# Patient Record
Sex: Male | Born: 1965 | Race: White | Hispanic: No | Marital: Single | Smoking: Former smoker
Health system: Southern US, Community
[De-identification: ages and names within clinical notes are randomized; demographics above are authoritative.]

## PROBLEM LIST (undated history)

## (undated) DIAGNOSIS — Z21 Asymptomatic human immunodeficiency virus [HIV] infection status: Secondary | ICD-10-CM

## (undated) DIAGNOSIS — B20 Human immunodeficiency virus [HIV] disease: Secondary | ICD-10-CM

## (undated) HISTORY — DX: Human immunodeficiency virus (HIV) disease: B20

## (undated) HISTORY — DX: Asymptomatic human immunodeficiency virus (hiv) infection status: Z21

---

## 2002-12-25 ENCOUNTER — Ambulatory Visit (HOSPITAL_BASED_OUTPATIENT_CLINIC_OR_DEPARTMENT_OTHER): Admission: RE | Admit: 2002-12-25 | Discharge: 2002-12-25 | Payer: Self-pay | Admitting: Surgery

## 2003-04-29 ENCOUNTER — Encounter: Payer: Self-pay | Admitting: Internal Medicine

## 2003-04-29 ENCOUNTER — Encounter: Admission: RE | Admit: 2003-04-29 | Discharge: 2003-04-29 | Payer: Self-pay | Admitting: Internal Medicine

## 2004-10-25 ENCOUNTER — Ambulatory Visit: Payer: Self-pay | Admitting: Internal Medicine

## 2006-10-11 ENCOUNTER — Ambulatory Visit: Payer: Self-pay | Admitting: Internal Medicine

## 2006-10-11 LAB — CONVERTED CEMR LAB
AST: 23 units/L (ref 0–37)
Albumin: 4.6 g/dL (ref 3.5–5.2)
Alkaline Phosphatase: 60 units/L (ref 39–117)
BUN: 10 mg/dL (ref 6–23)
CO2: 29 meq/L (ref 19–32)
Calcium: 9.4 mg/dL (ref 8.4–10.5)
Chol/HDL Ratio, serum: 4.2
GFR calc non Af Amer: 100 mL/min
Glomerular Filtration Rate, Af Am: 121 mL/min/{1.73_m2}
HCT: 41.7 % (ref 39.0–52.0)
Hemoglobin: 14.2 g/dL (ref 13.0–17.0)
LDL DIRECT: 133.3 mg/dL
MCHC: 34 g/dL (ref 30.0–36.0)
Platelets: 255 10*3/uL (ref 150–400)
Total Bilirubin: 1 mg/dL (ref 0.3–1.2)
Total Protein: 7.5 g/dL (ref 6.0–8.3)
WBC: 6 10*3/uL (ref 4.5–10.5)

## 2007-02-24 ENCOUNTER — Ambulatory Visit: Payer: Self-pay | Admitting: Internal Medicine

## 2007-02-25 ENCOUNTER — Encounter: Payer: Self-pay | Admitting: Internal Medicine

## 2007-02-25 LAB — CONVERTED CEMR LAB
Bilirubin Urine: NEGATIVE
Hemoglobin, Urine: NEGATIVE
Ketones, ur: NEGATIVE mg/dL
Microalb, Ur: 0.41 mg/dL (ref 0.00–1.89)
Protein, ur: NEGATIVE mg/dL
Urine Glucose: NEGATIVE mg/dL
Urobilinogen, UA: 0.2 (ref 0.0–1.0)
WBC, UA: NONE SEEN cells/hpf (ref <3)

## 2007-09-22 ENCOUNTER — Encounter: Payer: Self-pay | Admitting: Internal Medicine

## 2008-09-06 ENCOUNTER — Encounter: Payer: Self-pay | Admitting: Internal Medicine

## 2009-01-03 ENCOUNTER — Encounter: Payer: Self-pay | Admitting: Internal Medicine

## 2009-04-25 ENCOUNTER — Encounter: Payer: Self-pay | Admitting: Internal Medicine

## 2009-08-03 ENCOUNTER — Ambulatory Visit: Payer: Self-pay | Admitting: General Practice

## 2009-08-22 ENCOUNTER — Encounter: Payer: Self-pay | Admitting: Internal Medicine

## 2009-12-12 ENCOUNTER — Encounter: Payer: Self-pay | Admitting: Internal Medicine

## 2010-04-03 ENCOUNTER — Encounter: Payer: Self-pay | Admitting: Internal Medicine

## 2011-01-09 NOTE — Letter (Signed)
Summary: HIV, stable-- Infectious Diseases  [facility]   Imported By: Lanelle Bal 04/24/2010 08:32:52  _____________________________________________________________________  External Attachment:    Type:   Image     Comment:   External Document

## 2011-01-09 NOTE — Letter (Signed)
Summary: [facility] Infectious Diseases  [facility]   Imported By: Lanelle Bal 01/16/2010 13:50:19  _____________________________________________________________________  External Attachment:    Type:   Image     Comment:   External Document

## 2011-10-11 DIAGNOSIS — B20 Human immunodeficiency virus [HIV] disease: Secondary | ICD-10-CM | POA: Insufficient documentation

## 2015-03-25 ENCOUNTER — Ambulatory Visit (INDEPENDENT_AMBULATORY_CARE_PROVIDER_SITE_OTHER): Payer: PRIVATE HEALTH INSURANCE | Admitting: Family Medicine

## 2015-03-25 ENCOUNTER — Institutional Professional Consult (permissible substitution): Payer: Self-pay | Admitting: Medical

## 2015-03-25 VITALS — BP 118/68 | HR 86 | Ht 73.0 in | Wt 157.0 lb

## 2015-03-25 DIAGNOSIS — A071 Giardiasis [lambliasis]: Secondary | ICD-10-CM

## 2015-03-25 MED ORDER — METRONIDAZOLE 250 MG PO TABS: 250.0000 mg | ORAL_TABLET | Freq: Three times a day (TID) | ORAL | Status: DC

## 2015-03-25 NOTE — Progress Notes (Signed)
   Subjective:    Patient ID: Gregory Hamilton, male    DOB: 24-Mar-1966, 49 y.o.   MRN: [id number]  HPI His dog was recently diagnosed with giardiasis. Yesterday he noted loose also smelling stool and again this morning. No fever, chills or pain. He does complain of some slight bloating.   Review of Systems     Objective:   Physical Exam Alert and in no distress. Cardiac exam shows regular rhythm without murmurs or gallops. Lungs clear to auscultation. Abdominal exam shows decreased bowel sounds without masses or tenderness       Assessment & Plan:  Giardiasis - Plan: metroNIDAZOLE (FLAGYL) 250 MG tablet I explained that it is probably best to go ahead and treat rather than do a stool evaluation. He is comfortable with this. Discussed possible side effects from the medication.

## 2016-02-01 ENCOUNTER — Telehealth: Payer: Self-pay | Admitting: Family Medicine

## 2016-02-01 NOTE — Telephone Encounter (Signed)
Per [employer] a rx for flonase to [phone]

## 2016-05-22 DIAGNOSIS — Z006 Encounter for examination for normal comparison and control in clinical research program: Secondary | ICD-10-CM | POA: Insufficient documentation

## 2017-01-14 ENCOUNTER — Ambulatory Visit: Payer: Self-pay | Admitting: *Deleted

## 2017-01-14 ENCOUNTER — Encounter: Payer: Self-pay | Admitting: *Deleted

## 2017-01-14 VITALS — BP 108/78 | HR 88

## 2017-01-14 DIAGNOSIS — R002 Palpitations: Secondary | ICD-10-CM

## 2017-01-14 NOTE — Progress Notes (Signed)
Pt walked in to clinic reporting a few episodes of "fluttering" chest this past weekend. Lasted just a few seconds. Resolved with rest. Denies associated sx such as ShOB, chest pain, diaphoresis, n/v. Could not relate episodes to stress or anxiety. Denies heavy caffeine use. No cardiac Hx. Pt requested bilateral BPs. All VS WNL. Recommended starting a journal if sx recur containing time of day, activity, caffeine intake of day, stress level. Call PCP if they recur. ER precautions given for chest pain, ShOB, unresolving palpitations, diaphoresis. Pt verbalizes understanding and agreement. No further questions/concerns.

## 2017-01-28 ENCOUNTER — Ambulatory Visit: Payer: Self-pay | Admitting: *Deleted

## 2017-01-28 DIAGNOSIS — M546 Pain in thoracic spine: Secondary | ICD-10-CM

## 2017-01-28 NOTE — Progress Notes (Signed)
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 Felt a slight pull in his L upper back after he lifted it and pivoted to the R. Slight soreness during the day, but woke up Friday with more pain. Was not evaluated on Friday because the clinic was closed due to illness. This pain has persisted over the weekend. Presents with pain, persistent but worse with movement, to L upper back, just medial to L scapula. Has used Biofreeze and an IcyHot disposable TENS unit with some relief but it persists. Placed a Thermacare heat pad to site of pain. Instructed to remove after 8 hours and not use Biofreeze with pad in place. Also instructed 800mg  Ibuprofen 3x daily with food for inflammation/pain. Appt made with NP for tomorrow. Pt verbalizes understanding and agreement with plan of care.

## 2017-01-29 ENCOUNTER — Encounter: Payer: Self-pay | Admitting: Registered Nurse

## 2017-01-29 ENCOUNTER — Ambulatory Visit: Payer: Self-pay | Admitting: Registered Nurse

## 2017-01-29 VITALS — BP 128/84 | HR 83 | Temp 98.4°F

## 2017-01-29 DIAGNOSIS — M62838 Other muscle spasm: Secondary | ICD-10-CM

## 2017-01-29 DIAGNOSIS — S46912A Strain of unspecified muscle, fascia and tendon at shoulder and upper arm level, left arm, initial encounter: Secondary | ICD-10-CM

## 2017-01-29 NOTE — Progress Notes (Signed)
Subjective:    Patient ID: Gregory Hamilton, male    DOB: 03/03/1966, 51 y.o.   MRN: [id number]  50y/o single caucasian male here for evaluation of left shoulder/upper back/neck pain left side shoulder blade/neck and upper left arm tightness with certain movements of head.  Was lifting box in store on Friday 25 Jan 2017 and shoulder blade/neck area had twinge but over weekend worsened, trouble sleeping, pain with neck flexion and tilting head.   Has TENS unit at home and it helped the most.  Tried biofreeze, motrin maybe lowered pain level 1 point.  Tried stretches, hot shower.  Thermacare patch from clinic RN helped yesterday.  Denied bowel/bladder incontinence, saddle paresthesias, arm/leg weakness/falls/dropping items.  Did not take any motrin today or put on thermacare patch.      Review of Systems  Constitutional: Negative for activity change, appetite change, chills, diaphoresis, fatigue and fever.  HENT: Negative for congestion, ear pain, facial swelling, hearing loss, sore throat, trouble swallowing and voice change.   Eyes: Negative for pain and discharge.  Respiratory: Negative for cough and wheezing.   Cardiovascular: Negative for chest pain and leg swelling.  Gastrointestinal: Negative for blood in stool, constipation, diarrhea, nausea and vomiting.  Endocrine: Negative for cold intolerance and heat intolerance.  Genitourinary: Negative for difficulty urinating, dysuria and hematuria.  Musculoskeletal: Positive for back pain, myalgias and neck pain. Negative for arthralgias, gait problem, joint swelling and neck stiffness.  Skin: Negative for rash.  Allergic/Immunologic: Positive for immunocompromised state. Negative for environmental allergies and food allergies.  Neurological: Negative for dizziness, tremors, seizures, syncope, facial asymmetry, speech difficulty, weakness, light-headedness, numbness and headaches.  Hematological: Negative for adenopathy. Does not bruise/bleed  easily.  Psychiatric/Behavioral: Positive for sleep disturbance. Negative for agitation and confusion. The patient is not nervous/anxious.        Objective:   Physical Exam  Constitutional: He is oriented to person, place, and time. Vital signs are normal. He appears well-developed and well-nourished. He is active and cooperative.  Non-toxic appearance. He does not have a sickly appearance. He does not appear ill. No distress.  HENT:  Head: Normocephalic and atraumatic.  Right Ear: External ear and ear canal normal. A middle ear effusion is present.  Left Ear: Hearing, external ear and ear canal normal. A middle ear effusion is present.  Nose: No mucosal edema, rhinorrhea, nose lacerations, sinus tenderness, nasal deformity, septal deviation or nasal septal hematoma. No epistaxis.  No foreign bodies. Right sinus exhibits no maxillary sinus tenderness and no frontal sinus tenderness. Left sinus exhibits no maxillary sinus tenderness and no frontal sinus tenderness.  Mouth/Throat: Uvula is midline and mucous membranes are normal. Mucous membranes are not pale, not dry and not cyanotic. He does not have dentures. No oral lesions. No trismus in the jaw. Normal dentition. No dental abscesses, uvula swelling, lacerations or dental caries. Posterior oropharyngeal edema and posterior oropharyngeal erythema present. No oropharyngeal exudate or tonsillar abscesses.  Cobblestoning posterior pharynx; nasally voice and bilateral TMs with air fluid level clear  Eyes: Conjunctivae, EOM and lids are normal. Pupils are equal, round, and reactive to light. Right eye exhibits no chemosis, no discharge, no exudate and no hordeolum. No foreign body present in the right eye. Left eye exhibits no chemosis, no discharge, no exudate and no hordeolum. No foreign body present in the left eye. Right conjunctiva is not injected. Right conjunctiva has no hemorrhage. Left conjunctiva is not injected. Left conjunctiva has no  hemorrhage. No scleral  icterus. Right eye exhibits normal extraocular motion and no nystagmus. Left eye exhibits normal extraocular motion and no nystagmus. Right pupil is round and reactive. Left pupil is round and reactive. Pupils are equal.  Neck: Trachea normal, normal range of motion and phonation normal. Neck supple. Muscular tenderness present. No tracheal tenderness and no spinous process tenderness present. No neck rigidity. No tracheal deviation, no edema, no erythema and normal range of motion present. No thyroid mass and no thyromegaly present.    Cardiovascular: Normal rate, regular rhythm, S1 normal, S2 normal, normal heart sounds and intact distal pulses.  PMI is not displaced.  Exam reveals no gallop and no friction rub.   No murmur heard. Pulses:      Radial pulses are 2+ on the right side, and 2+ on the left side.  Pulmonary/Chest: Effort normal and breath sounds normal. No stridor. No respiratory distress. He has no decreased breath sounds. He has no wheezes. He has no rhonchi. He has no rales.  Abdominal: Soft. He exhibits no distension.  Musculoskeletal: Normal range of motion. He exhibits tenderness. He exhibits no edema or deformity.       Right shoulder: Normal.       Left shoulder: He exhibits tenderness, pain and spasm. He exhibits normal range of motion, no bony tenderness, no swelling, no effusion, no crepitus, no deformity, no laceration, normal pulse and normal strength.       Right elbow: Normal.      Left elbow: Normal.       Right wrist: Normal.       Left wrist: Normal.       Right hip: Normal.       Left hip: Normal.       Right knee: Normal.       Left knee: Normal.       Cervical back: He exhibits tenderness, pain and spasm. He exhibits normal range of motion, no bony tenderness, no swelling, no edema, no deformity, no laceration and normal pulse.       Right upper arm: Normal.       Left upper arm: He exhibits no tenderness, no bony tenderness, no  swelling, no edema, no deformity and no laceration.       Right forearm: Normal.       Left forearm: Normal.       Right hand: Normal.       Left hand: Normal.  Increased left paraspinal and scapular discomfort with neck flexion and left lateral bending; AROM normal; bilateral hand grasp/upper and lower extremity strength 5/5 bilaterally equal; on off exam table and chair without difficulty gait sure and steady normal heel/toe gait  Lymphadenopathy:       Head (right side): No submental, no submandibular, no tonsillar, no preauricular, no posterior auricular and no occipital adenopathy present.       Head (left side): No submental, no submandibular, no tonsillar, no preauricular, no posterior auricular and no occipital adenopathy present.    He has no cervical adenopathy.       Right cervical: No superficial cervical, no deep cervical and no posterior cervical adenopathy present.      Left cervical: No superficial cervical, no deep cervical and no posterior cervical adenopathy present.  Neurological: He is alert and oriented to person, place, and time. He has normal reflexes. He displays no atrophy, no tremor and normal reflexes. No cranial nerve deficit or sensory deficit. He exhibits normal muscle tone. He displays no seizure activity. Coordination  and gait normal. GCS eye subscore is 4. GCS verbal subscore is 5. GCS motor subscore is 6.  Reflex Scores:      Bicep reflexes are 2+ on the right side and 2+ on the left side.      Brachioradialis reflexes are 2+ on the right side and 2+ on the left side.      Patellar reflexes are 2+ on the right side and 2+ on the left side. Skin: Skin is warm, dry and intact. No abrasion, no bruising, no burn, no ecchymosis, no laceration, no lesion, no petechiae and no rash noted. He is not diaphoretic. No cyanosis or erythema. No pallor. Nails show no clubbing.  Psychiatric: He has a normal mood and affect. His speech is normal and behavior is normal. Judgment  and thought content normal. Cognition and memory are normal.  Nursing note and vitals reviewed.         Assessment & Plan:  A-cervical and scapular strain, muscle spasms  P-Diclofenac 75mg  po BID x2 weeks #30 RF0.  Flexeril 10mg  po QHS prn muscle spasms #30 [facility].  Tylenol 1000mg  po q6h prn pain given 4 UD from clinic stock.  Given thermacare neck patch from clinic stock.  Has biofreeze at home may continue QID topical application over affected areas.  Home stretches demonstrated to patient-e.g. Arm circles, walking up wall, chest stretches, neck AROM, chin tucks, knee to chest and rock side to side on back.  Consider physical therapy referral if no improvement with prescribed therapy.  Ensure ergonomics correct desk at work avoid repetitive motions if possible/holding phone/laptop in hand use desk/stand.  Patient was instructed to rest, ice, and ROM exercises.  Cryotherapy and/or heat therapy, epsom salt bath soak.  Follow up with supervisor to notify them of injury at work.  RN Dereck LeepHailey Workman notified and to follow up with HR.  Activity as tolerated.  Avoid alcohol intake while taking flexeril discussed with patient it can cause drowsiness.  Avoid driving.  Discussed with patient his chronic meds and NSAIDs can put increase strain on kidneys, hydrate, avoid dehyrdation and do not take more than 1 NSAID at a time/in a 24 hours period.  Follow up if symptoms persist or worsen especially red flags saddle paresthesias/arm/leg weakness, incontinence bowel and/or bladder. Demonstrated exercises. Patient verbalized agreement and understanding of treatment plan and had no further questions at this time.  P2:  Injury Prevention and Fitness.

## 2017-01-29 NOTE — Progress Notes (Signed)
Pt with continued back pain. L upper back, just medial to L scapula. 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 Lifted and pivoted to R, felt something pull. Has tried Biofreeze, heat, and Ibuprofen with some relief but pain persists. Exacerbated by certain movements.

## 2017-01-29 NOTE — Patient Instructions (Addendum)
Gentle AROM Hydrate Stop motrin start diclofenac 75mg  by mouth every 12 hours May apply thermacare patch daily for 12 hours then remove Epsom salt soak Cryotherapy or heat therapy 15 minutes 4 times per day Neck/shoulder and upper back stretching with tennis ball, strap three times per day May take tylenol 1000mg  by mouth every 6 hours Notify supervisor regarding his injury at work

## 2017-03-07 ENCOUNTER — Ambulatory Visit: Payer: Self-pay | Admitting: Registered Nurse

## 2017-03-07 VITALS — BP 112/78 | HR 79 | Temp 97.7°F

## 2017-03-07 DIAGNOSIS — B9689 Other specified bacterial agents as the cause of diseases classified elsewhere: Secondary | ICD-10-CM

## 2017-03-07 DIAGNOSIS — J019 Acute sinusitis, unspecified: Principal | ICD-10-CM

## 2017-03-07 DIAGNOSIS — Z20828 Contact with and (suspected) exposure to other viral communicable diseases: Secondary | ICD-10-CM

## 2017-03-07 MED ORDER — SALINE SPRAY 0.65 % NA SOLN: 2.0000 | NASAL | 0 refills | Status: DC

## 2017-03-07 MED ORDER — AZITHROMYCIN 250 MG PO TABS: ORAL_TABLET | ORAL | 0 refills | Status: DC

## 2017-03-07 MED ORDER — OSELTAMIVIR PHOSPHATE 75 MG PO CAPS: 75.0000 mg | ORAL_CAPSULE | Freq: Two times a day (BID) | ORAL | 0 refills | Status: AC

## 2017-03-07 NOTE — Progress Notes (Signed)
Subjective:    Patient ID: Gregory Hamilton, male    DOB: 05/26/1966, 51 y.o.   MRN: [id number]  Single 51 y/o caucasian male with congestion and dry, hacky cough. Pt did quit smoking 1 week ago and believes it may be r/t that. Did have bil ear effusions 1 month ago in clinic. With persistant congestion and now new cough, wants to ensure nothing viral/bacterial is starting. Has been exposed to coworkers with flu this week concerned as immunosuppressed.  Infectious disease provider told him to stop flonase. Taking zyrtec and using nasal saline.  Feeling run down.  Last week upper teeth left and cheek was hurting but resolved.      Review of Systems  Constitutional: Positive for fatigue. Negative for activity change, appetite change, chills, diaphoresis, fever and unexpected weight change.  HENT: Positive for congestion, postnasal drip and rhinorrhea. Negative for dental problem, drooling, ear discharge, ear pain, facial swelling, hearing loss, mouth sores, nosebleeds, sinus pain, sinus pressure, sneezing, sore throat, tinnitus, trouble swallowing and voice change.   Eyes: Negative for photophobia, pain, discharge, redness, itching and visual disturbance.  Respiratory: Positive for cough. Negative for choking, chest tightness, shortness of breath, wheezing and stridor.   Cardiovascular: Negative for chest pain, palpitations and leg swelling.  Gastrointestinal: Negative for abdominal distention, abdominal pain, blood in stool, constipation, diarrhea, nausea and vomiting.  Endocrine: Negative for cold intolerance and heat intolerance.  Genitourinary: Negative for dysuria.  Musculoskeletal: Negative for arthralgias, back pain, gait problem, joint swelling, myalgias, neck pain and neck stiffness.  Skin: Negative for color change, pallor, rash and wound.  Allergic/Immunologic: Positive for environmental allergies. Negative for food allergies and immunocompromised state.  Neurological: Negative for  dizziness, tremors, seizures, syncope, facial asymmetry, speech difficulty, weakness, light-headedness, numbness and headaches.  Hematological: Negative for adenopathy. Does not bruise/bleed easily.  Psychiatric/Behavioral: Negative for agitation, behavioral problems, confusion and sleep disturbance.       Objective:   Physical Exam  Constitutional: He is oriented to person, place, and time. Vital signs are normal. He appears well-developed and well-nourished. He is active and cooperative.  Non-toxic appearance. He does not have a sickly appearance. He appears ill. No distress.  HENT:  Head: Normocephalic and atraumatic.  Right Ear: Hearing, external ear and ear canal normal. A middle ear effusion is present.  Left Ear: Hearing, external ear and ear canal normal. A middle ear effusion is present.  Nose: Mucosal edema and rhinorrhea present. No nose lacerations, sinus tenderness, nasal deformity, septal deviation or nasal septal hematoma. No epistaxis.  No foreign bodies. Right sinus exhibits no maxillary sinus tenderness and no frontal sinus tenderness. Left sinus exhibits no maxillary sinus tenderness and no frontal sinus tenderness.  Mouth/Throat: Uvula is midline and mucous membranes are normal. Mucous membranes are not pale, not dry and not cyanotic. He does not have dentures. No oral lesions. No trismus in the jaw. Normal dentition. No dental abscesses, uvula swelling, lacerations or dental caries. Posterior oropharyngeal edema and posterior oropharyngeal erythema present. No oropharyngeal exudate or tonsillar abscesses.  Cobblestoning posterior pharynx; bilateral TMs air fluid level clear; bilateral nasal turbinates edema/erythema clear discharge; bilateral allergic shiners  Eyes: Conjunctivae, EOM and lids are normal. Pupils are equal, round, and reactive to light. Right eye exhibits no chemosis, no discharge, no exudate and no hordeolum. No foreign body present in the right eye. Left eye  exhibits no chemosis, no discharge, no exudate and no hordeolum. No foreign body present in the left eye.  Right conjunctiva is not injected. Right conjunctiva has no hemorrhage. Left conjunctiva is not injected. Left conjunctiva has no hemorrhage. No scleral icterus. Right eye exhibits normal extraocular motion and no nystagmus. Left eye exhibits normal extraocular motion and no nystagmus. Right pupil is round and reactive. Left pupil is round and reactive. Pupils are equal.  Neck: Trachea normal and normal range of motion. Neck supple. No tracheal tenderness, no spinous process tenderness and no muscular tenderness present. No neck rigidity. No tracheal deviation, no edema, no erythema and normal range of motion present. No thyroid mass and no thyromegaly present.  Cardiovascular: Normal rate, regular rhythm, S1 normal, S2 normal, normal heart sounds and intact distal pulses.  PMI is not displaced.  Exam reveals no gallop and no friction rub.   No murmur heard. Pulmonary/Chest: Effort normal and breath sounds normal. No stridor. No respiratory distress. He has no decreased breath sounds. He has no wheezes. He has no rhonchi. He has no rales.  Speaks full sentences without difficulty; no cough observed in clinic  Abdominal: Soft. He exhibits no distension.  Musculoskeletal: Normal range of motion. He exhibits no edema or tenderness.       Right shoulder: Normal.       Left shoulder: Normal.       Right elbow: Normal.      Left elbow: Normal.       Right hip: Normal.       Left hip: Normal.       Right knee: Normal.       Left knee: Normal.       Cervical back: Normal.  Lymphadenopathy:       Head (right side): No submental, no submandibular, no tonsillar, no preauricular, no posterior auricular and no occipital adenopathy present.       Head (left side): No submental, no submandibular, no tonsillar, no preauricular, no posterior auricular and no occipital adenopathy present.    He has no cervical  adenopathy.       Right cervical: No superficial cervical, no deep cervical and no posterior cervical adenopathy present.      Left cervical: No superficial cervical, no deep cervical and no posterior cervical adenopathy present.  Neurological: He is alert and oriented to person, place, and time. He is not disoriented. He displays no atrophy and no tremor. No cranial nerve deficit or sensory deficit. He exhibits normal muscle tone. He displays no seizure activity. Coordination and gait normal. GCS eye subscore is 4. GCS verbal subscore is 5. GCS motor subscore is 6.  Skin: Skin is warm, dry and intact. No abrasion, no bruising, no burn, no ecchymosis, no laceration, no lesion, no petechiae and no rash noted. He is not diaphoretic. No cyanosis or erythema. No pallor. Nails show no clubbing.  Psychiatric: He has a normal mood and affect. His speech is normal and behavior is normal. Judgment and thought content normal. Cognition and memory are normal.  Nursing note and vitals reviewed.         Assessment & Plan:  Electronic Rx tamiflu 75mg  po BID x 5 days #10 RF0  Hydrate hydrate hydrate.if unable to maintain po intake or urine orange/brown/unable to void every 8 hours follow up with PCM/UCC/ER for re-evaluation  Tylenol 1000mg  po q6h prn pain/fever. Sp02 decreased from usual 98%+.  Given azithromycin 500mg  po day 1 then 250mg  po daily days 2-5 #6 RF0 from [facility] as immunocompromised also.  Suspect Viral illness: no evidence of invasive bacterial infection, non toxic  and well hydrated.  This is most likely self limiting viral infection.  I do not see where any further testing or imaging is necessary at this time.   I will suggest supportive care, rest, good hygiene and encourage the patient to take adequate fluids.  Does not require work excuse. Patient to talk with supervisor discussed recommend staying home until afebrile 24 hours off tylenol. nasal saline 1-2 sprays each nostril prn q2h,.  Discussed  honey with lemon and salt water gargles for comfort also.  The patient is to return to clinic or EMERGENCY ROOM if symptoms worsen or change significantly e.g. Dyspnea, dysphagia, vomiting, lethargy, SOB, wheezing. Patient verbalized agreement and understanding of treatment plan and had no further questions at this time.  Patient may use normal saline nasal spray as needed.  Continue antihistamine oral consider antihistamine nasal since unable to utilize nasal steroid per infectious disease provider.  Avoid triggers if possible.  Shower prior to bedtime if exposed to triggers.  If allergic dust/dust mites recommend mattress/pillow covers/encasements; washing linens, vacuuming, sweeping, dusting weekly.  Call or return to clinic as needed if these symptoms worsen or fail to improve as anticipated.   Exitcare handout on allergic rhinitis given to patient.  Patient verbalized understanding of instructions, agreed with plan of care and had no further questions at this time.  P2:  Avoidance and hand washing.   Symptomatic therapy suggested fluids, NSAIDs and rest.  May take Tylenol or Motrin for fevers.  Call or return to clinic as needed if these symptoms worsen or fail to improve as anticipated. Exitcare handout on otitis media effusion given to patient.  Patient verbalized agreement and understanding of treatment plan.   P2:  Hand washing  Bronchitis simple, community acquired, may have started as viral (probably respiratory syncytial, parainfluenza, influenza, or adenovirus), but now evidence of acute purulent bronchitis with resultant bronchial edema and mucus formation.  Viruses are the most common cause of bronchial inflammation in otherwise healthy adults with acute bronchitis.  The appearance of sputum is not predictive of whether a bacterial infection is present.  Purulent sputum is most often caused by viral infections.  There are a small portion of those caused by non-viral agents being Mycoplamsa  pneumonia.  Microscopic examination or C&S of sputum in the healthy adult with acute bronchitis is generally not helpful (usually negative or normal respiratory flora) other considerations being cough from upper respiratory tract infections, sinusitis or allergic syndromes (mild asthma or viral pneumonia).  Differential Diagnosis:  reactive airway disease (asthma, allergic aspergillosis (eosinophilia), chronic bronchitis, respiratory infection (Sinusitis, Common cold, pneumonia), congestive heart failure, reflux esophagitis, bronchogenic tumor, aspiration syndromes and/or exposure irritants/tobacco smoke.  In this case, there is no evidence of any invasive bacterial illness.  Most likely viral etiology so will hold on antibiotic treatment.  Advise supportive care with rest, encourage fluids, good hygiene and watch for any worsening symptoms.  If they were to develop:  come back to the office or go to the emergency room if after hours. Without high fever, severe dyspnea, lack of physical findings or other risk factors, I will hold on a chest radiograph and CBC at this time. I discussed that approximately 50% of patients with acute bronchitis have a cough that lasts up to three weeks, and 25% for over a month.  Tylenol, one to two tablets every four hours as needed for fever or myalgias.   No aspirin.  Patient instructed to follow up in one week or sooner  if symptoms worsen. Patient verbalized agreement and understanding of treatment plan.  P2:  hand washing and cover cough

## 2017-03-07 NOTE — Patient Instructions (Addendum)
Continue mucinex dm by mouth twice a day Zyrtec 10mg  by mouth daily Nasal saline 2 sprays each nostril every 2 hours while awake as needed for congestion Use over the counter cough suppressant delsym, nyquil and/or robitussin as needed per manufacturer's instructions Cough drops by mouth ever 2 hours as needed for cough Shower twice a day Hydrate, honey with lemon, popsicles Rest  Start tamiflu 75mg  by mouth twice a day for 5 days Tylenol 1000mg  by mouth every 6 hours as needed for pain  If worsening sinus pressure/sore throat/ear pain/productive cough start azithromycin 500mg  by mouth day 1 (2 tabs) then take 1 tablet 250mg  by mouth day days 2-5 dispensed from [facility]  Seek re-evaluation at PCM/urgent care/ER same day if difficulty breathing/shortness of breath/coughing up blood, vomiting blood, pooping or peeing blood red or black.  If unable to keep down fluids, getting dizzy, short of breath or unable to pee every 6 -8 hours (urine orange/brown)   Influenza, Adult Influenza, more commonly known as "the flu," is a viral infection that primarily affects the respiratory tract. The respiratory tract includes organs that help you breathe, such as the lungs, nose, and throat. The flu causes many common cold symptoms, as well as a high fever and body aches. The flu spreads easily from person to person (is contagious). Getting a flu shot (influenza vaccination) every year is the best way to prevent influenza. What are the causes? Influenza is caused by a virus. You can catch the virus by:  Breathing in droplets from an infected person's cough or sneeze.  Touching something that was recently contaminated with the virus and then touching your mouth, nose, or eyes. What increases the risk? The following factors may make you more likely to get the flu:  Not cleaning your hands frequently with soap and water or alcohol-based hand sanitizer.  Having close contact with many people during cold and  flu season.  Touching your mouth, eyes, or nose without washing or sanitizing your hands first.  Not drinking enough fluids or not eating a healthy diet.  Not getting enough sleep or exercise.  Being under a high amount of stress.  Not getting a yearly (annual) flu shot. You may be at a higher risk of complications from the flu, such as a severe lung infection (pneumonia), if you:  Are over the age of 42.  Are pregnant.  Have a weakened disease-fighting system (immune system). You may have a weakened immune system if you:  Have HIV or AIDS.  Are undergoing chemotherapy.  Aretaking medicines that reduce the activity of (suppress) the immune system.  Have a long-term (chronic) illness, such as heart disease, kidney disease, diabetes, or lung disease.  Have a liver disorder.  Are obese.  Have anemia. What are the signs or symptoms? Symptoms of this condition typically last 4-10 days and may include:  Fever.  Chills.  Headache, body aches, or muscle aches.  Sore throat.  Cough.  Runny or congested nose.  Chest discomfort and cough.  Poor appetite.  Weakness or tiredness (fatigue).  Dizziness.  Nausea or vomiting. How is this diagnosed? This condition may be diagnosed based on your medical history and a physical exam. Your health care provider may do a nose or throat swab test to confirm the diagnosis. How is this treated? If influenza is detected early, you can be treated with antiviral medicine that can reduce the length of your illness and the severity of your symptoms. This medicine may be given by  mouth (orally) or through an IV tube that is inserted in one of your veins. The goal of treatment is to relieve symptoms by taking care of yourself at home. This may include taking over-the-counter medicines, drinking plenty of fluids, and adding humidity to the air in your home. In some cases, influenza goes away on its own. Severe influenza or complications  from influenza may be treated in a hospital. Follow these instructions at home:  Take over-the-counter and prescription medicines only as told by your health care provider.  Use a cool mist humidifier to add humidity to the air in your home. This can make breathing easier.  Rest as needed.  Drink enough fluid to keep your urine clear or pale yellow.  Cover your mouth and nose when you cough or sneeze.  Wash your hands with soap and water often, especially after you cough or sneeze. If soap and water are not available, use hand sanitizer.  Stay home from work or school as told by your health care provider. Unless you are visiting your health care provider, try to avoid leaving home until your fever has been gone for 24 hours without the use of medicine.  Keep all follow-up visits as told by your health care provider. This is important. How is this prevented?  Getting an annual flu shot is the best way to avoid getting the flu. You may get the flu shot in late summer, fall, or winter. Ask your health care provider when you should get your flu shot.  Wash your hands often or use hand sanitizer often.  Avoid contact with people who are sick during cold and flu season.  Eat a healthy diet, drink plenty of fluids, get enough sleep, and exercise regularly. Contact a health care provider if:  You develop new symptoms.  You have:  Chest pain.  Diarrhea.  A fever.  Your cough gets worse.  You produce more mucus.  You feel nauseous or you vomit. Get help right away if:  You develop shortness of breath or difficulty breathing.  Your skin or nails turn a bluish color.  You have severe pain or stiffness in your neck.  You develop a sudden headache or sudden pain in your face or ear.  You cannot stop vomiting. This information is not intended to replace advice given to you by your health care provider. Make sure you discuss any questions you have with your health care  provider. Document Released: 11/23/2000 Document Revised: 05/03/2016 Document Reviewed: 09/20/2015 Elsevier Interactive Patient Education  2017 [employer]  Acute Bronchitis, Adult Acute bronchitis is sudden (acute) swelling of the air tubes (bronchi) in the lungs. Acute bronchitis causes these tubes to fill with mucus, which can make it hard to breathe. It can also cause coughing or wheezing. In adults, acute bronchitis usually goes away within 2 weeks. A cough caused by bronchitis may last up to 3 weeks. Smoking, allergies, and asthma can make the condition worse. Repeated episodes of bronchitis may cause further lung problems, such as chronic obstructive pulmonary disease (COPD). What are the causes? This condition can be caused by germs and by substances that irritate the lungs, including:  Cold and flu viruses. This condition is most often caused by the same virus that causes a cold.  Bacteria.  Exposure to tobacco smoke, dust, fumes, and air pollution. What increases the risk? This condition is more likely to develop in people who:  Have close contact with someone with acute bronchitis.  Are exposed  to lung irritants, such as tobacco smoke, dust, fumes, and vapors.  Have a weak immune system.  Have a respiratory condition such as asthma. What are the signs or symptoms? Symptoms of this condition include:  A cough.  Coughing up clear, yellow, or green mucus.  Wheezing.  Chest congestion.  Shortness of breath.  A fever.  Body aches.  Chills.  A sore throat. How is this diagnosed? This condition is usually diagnosed with a physical exam. During the exam, your health care provider may order tests, such as chest X-rays, to rule out other conditions. He or she may also:  Test a sample of your mucus for bacterial infection.  Check the level of oxygen in your blood. This is done to check for pneumonia.  Do a chest X-ray or lung function testing to rule out  pneumonia and other conditions.  Perform blood tests. Your health care provider will also ask about your symptoms and medical history. How is this treated? Most cases of acute bronchitis clear up over time without treatment. Your health care provider may recommend:  Drinking more fluids. Drinking more makes your mucus thinner, which may make it easier to breathe.  Taking a medicine for a fever or cough.  Taking an antibiotic medicine.  Using an inhaler to help improve shortness of breath and to control a cough.  Using a cool mist vaporizer or humidifier to make it easier to breathe. Follow these instructions at home: Medicines   Take over-the-counter and prescription medicines only as told by your health care provider.  If you were prescribed an antibiotic, take it as told by your health care provider. Do not stop taking the antibiotic even if you start to feel better. General instructions   Get plenty of rest.  Drink enough fluids to keep your urine clear or pale yellow.  Avoid smoking and secondhand smoke. Exposure to cigarette smoke or irritating chemicals will make bronchitis worse. If you smoke and you need help quitting, ask your health care provider. Quitting smoking will help your lungs heal faster.  Use an inhaler, cool mist vaporizer, or humidifier as told by your health care provider.  Keep all follow-up visits as told by your health care provider. This is important. How is this prevented? To lower your risk of getting this condition again:  Wash your hands often with soap and water. If soap and water are not available, use hand sanitizer.  Avoid contact with people who have cold symptoms.  Try not to touch your hands to your mouth, nose, or eyes.  Make sure to get the flu shot every year. Contact a health care provider if:  Your symptoms do not improve in 2 weeks of treatment. Get help right away if:  You cough up blood.  You have chest pain.  You have  severe shortness of breath.  You become dehydrated.  You faint or keep feeling like you are going to faint.  You keep vomiting.  You have a severe headache.  Your fever or chills gets worse. This information is not intended to replace advice given to you by your health care provider. Make sure you discuss any questions you have with your health care provider. Document Released: 01/03/2005 Document Revised: 06/20/2016 Document Reviewed: 05/16/2016 Elsevier Interactive Patient Education  2017 [employer]  Sinusitis, Adult Sinusitis is soreness and inflammation of your sinuses. Sinuses are hollow spaces in the bones around your face. Your sinuses are located:  Around your eyes.  In the  middle of your forehead.  Behind your nose.  In your cheekbones. Your sinuses and nasal passages are lined with a stringy fluid (mucus). Mucus normally drains out of your sinuses. When your nasal tissues become inflamed or swollen, the mucus can become trapped or blocked so air cannot flow through your sinuses. This allows bacteria, viruses, and funguses to grow, which leads to infection. Sinusitis can develop quickly and last for 7?10 days (acute) or for more than 12 weeks (chronic). Sinusitis often develops after a cold. What are the causes? This condition is caused by anything that creates swelling in the sinuses or stops mucus from draining, including:  Allergies.  Asthma.  Bacterial or viral infection.  Abnormally shaped bones between the nasal passages.  Nasal growths that contain mucus (nasal polyps).  Narrow sinus openings.  Pollutants, such as chemicals or irritants in the air.  A foreign object stuck in the nose.  A fungal infection. This is rare. What increases the risk? The following factors may make you more likely to develop this condition:  Having allergies or asthma.  Having had a recent cold or respiratory tract infection.  Having structural deformities or  blockages in your nose or sinuses.  Having a weak immune system.  Doing a lot of swimming or diving.  Overusing nasal sprays.  Smoking. What are the signs or symptoms? The main symptoms of this condition are pain and a feeling of pressure around the affected sinuses. Other symptoms include:  Upper toothache.  Earache.  Headache.  Bad breath.  Decreased sense of smell and taste.  A cough that may get worse at night.  Fatigue.  Fever.  Thick drainage from your nose. The drainage is often green and it may contain pus (purulent).  Stuffy nose or congestion.  Postnasal drip. This is when extra mucus collects in the throat or back of the nose.  Swelling and warmth over the affected sinuses.  Sore throat.  Sensitivity to light. How is this diagnosed? This condition is diagnosed based on symptoms, a medical history, and a physical exam. To find out if your condition is acute or chronic, your health care provider may:  Look in your nose for signs of nasal polyps.  Tap over the affected sinus to check for signs of infection.  View the inside of your sinuses using an imaging device that has a light attached (endoscope). If your health care provider suspects that you have chronic sinusitis, you may also:  Be tested for allergies.  Have a sample of mucus taken from your nose (nasal culture) and checked for bacteria.  Have a mucus sample examined to see if your sinusitis is related to an allergy. If your sinusitis does not respond to treatment and it lasts longer than 8 weeks, you may have an MRI or CT scan to check your sinuses. These scans also help to determine how severe your infection is. In rare cases, a bone biopsy may be done to rule out more serious types of fungal sinus disease. How is this treated? Treatment for sinusitis depends on the cause and whether your condition is chronic or acute. If a virus is causing your sinusitis, your symptoms will go away on their  own within 10 days. You may be given medicines to relieve your symptoms, including:  Topical nasal decongestants. They shrink swollen nasal passages and let mucus drain from your sinuses.  Antihistamines. These drugs block inflammation that is triggered by allergies. This can help to ease swelling in your  nose and sinuses.  Topical nasal corticosteroids. These are nasal sprays that ease inflammation and swelling in your nose and sinuses.  Nasal saline washes. These rinses can help to get rid of thick mucus in your nose. If your condition is caused by bacteria, you will be given an antibiotic medicine. If your condition is caused by a fungus, you will be given an antifungal medicine. Surgery may be needed to correct underlying conditions, such as narrow nasal passages. Surgery may also be needed to remove polyps. Follow these instructions at home: Medicines   Take, use, or apply over-the-counter and prescription medicines only as told by your health care provider. These may include nasal sprays.  If you were prescribed an antibiotic medicine, take it as told by your health care provider. Do not stop taking the antibiotic even if you start to feel better. Hydrate and Humidify   Drink enough water to keep your urine clear or pale yellow. Staying hydrated will help to thin your mucus.  Use a cool mist humidifier to keep the humidity level in your home above 50%.  Inhale steam for 10-15 minutes, 3-4 times a day or as told by your health care provider. You can do this in the bathroom while a hot shower is running.  Limit your exposure to cool or dry air. Rest   Rest as much as possible.  Sleep with your head raised (elevated).  Make sure to get enough sleep each night. General instructions   Apply a warm, moist washcloth to your face 3-4 times a day or as told by your health care provider. This will help with discomfort.  Wash your hands often with soap and water to reduce your  exposure to viruses and other germs. If soap and water are not available, use hand sanitizer.  Do not smoke. Avoid being around people who are smoking (secondhand smoke).  Keep all follow-up visits as told by your health care provider. This is important. Contact a health care provider if:  You have a fever.  Your symptoms get worse.  Your symptoms do not improve within 10 days. Get help right away if:  You have a severe headache.  You have persistent vomiting.  You have pain or swelling around your face or eyes.  You have vision problems.  You develop confusion.  Your neck is stiff.  You have trouble breathing. This information is not intended to replace advice given to you by your health care provider. Make sure you discuss any questions you have with your health care provider. Document Released: 11/26/2005 Document Revised: 07/22/2016 Document Reviewed: 09/21/2015 Elsevier Interactive Patient Education  2017 [employer].

## 2017-09-03 ENCOUNTER — Encounter: Payer: Self-pay | Admitting: Family Medicine

## 2017-09-03 ENCOUNTER — Other Ambulatory Visit: Payer: Self-pay | Admitting: Family Medicine

## 2017-09-03 ENCOUNTER — Ambulatory Visit (HOSPITAL_COMMUNITY)
Admission: RE | Admit: 2017-09-03 | Discharge: 2017-09-03 | Disposition: A | Discharge status: Home / Self Care | Payer: No Typology Code available for payment source | Source: Ambulatory Visit | Attending: Registered Nurse | Admitting: Registered Nurse

## 2017-09-03 ENCOUNTER — Telehealth: Payer: Self-pay | Admitting: Family Medicine

## 2017-09-03 ENCOUNTER — Ambulatory Visit: Payer: Self-pay | Admitting: Registered Nurse

## 2017-09-03 DIAGNOSIS — X58XXXA Exposure to other specified factors, initial encounter: Secondary | ICD-10-CM | POA: Diagnosis not present

## 2017-09-03 DIAGNOSIS — S43014A Anterior dislocation of right humerus, initial encounter: Secondary | ICD-10-CM

## 2017-09-03 DIAGNOSIS — S43004A Unspecified dislocation of right shoulder joint, initial encounter: Secondary | ICD-10-CM

## 2017-09-03 MED ORDER — IBUPROFEN 800 MG PO TABS: 800.0000 mg | ORAL_TABLET | Freq: Three times a day (TID) | ORAL | 0 refills | Status: DC

## 2017-09-03 MED ORDER — ACETAMINOPHEN 500 MG PO TABS: 1000.0000 mg | ORAL_TABLET | Freq: Four times a day (QID) | ORAL | 0 refills | Status: AC | PRN

## 2017-09-03 NOTE — Progress Notes (Addendum)
Patient ID: Gregory Hamilton, male   DOB: Sep 13, 1966, 51 y.o.   MRN: [id number]  Spoke with pt via phone [phone] to give x-ray results. Sts he has already spoken with pcp/[facility] about results and has been ordered a sling. PCP office is supposed to be sending restriction documentation to [employer] regarding sling use for next week minimum. Plan to enter shoulder rehabilitation soon after initial healing period. Pt had no questions or concerns.

## 2017-09-03 NOTE — Progress Notes (Signed)
He injured his right shoulder playing volleyball while at work.he has a previous history of shoulder dislocation and subsequent surgery. He relocated his shoulder on his own. X-rays were reviewed and show no fracture. I will give him a sling and set him up for shoulder rehabilitation. Discussed the fact that he might end up having to have surgery again if this continues to dislocate. He is well aware of this problem.

## 2017-09-03 NOTE — Patient Instructions (Addendum)
Contact your ortho provider and schedule appt ER if fingers/arm cold/numb Ice/immobilize today right arm may use pillow under right arm Then gentle arom avoid impact/heavy lifting with right arm Xray today right shoulder Consider contacting [facility] for PT referral  Shoulder Dislocation A shoulder dislocation happens when the upper arm bone (humerus) moves out of the shoulder joint. The shoulder joint is the part of the shoulder where the humerus, shoulder blade (scapula), and collarbone (clavicle) meet. What are the causes? This condition is often caused by:  A fall.  A hit to the shoulder.  A forceful movement of the shoulder.  What increases the risk? This condition is more likely to develop in people who play sports. What are the signs or symptoms? Symptoms of this condition include:  Deformity of the shoulder.  Intense pain.  Inability to move the shoulder.  Numbness, weakness, or tingling in your neck or down your arm.  Bruising or swelling around your shoulder.  How is this diagnosed? This condition is diagnosed with a physical exam. After the exam, tests may be done to check for related problems. Tests that may be done include:  X-ray. This may be done to check for broken bones.  MRI. This may be done to check for damage to the tissues around the shoulder.  Electromyogram. This may be done to check for nerve damage.  How is this treated? This condition is treated with a procedure to place the humerus back in the joint. This procedure is called a reduction. There are two types of reduction:  Closed reduction. In this procedure, the humerus is placed back in the joint without surgery. The health care provider uses his or her hands to guide the bone back into place.  Open reduction. In this procedure, the humerus is placed back in the joint with surgery. An open reduction may be recommended if: ? You have a weak shoulder joint or weak ligaments. ? You have had more  than one shoulder dislocation. ? The nerves or blood vessels around your shoulder have been damaged.  After the humerus is placed back into the joint, your arm will be placed in a splint or sling to prevent it from moving. You will need to wear the splint or sling until your shoulder heals. When the splint or sling is removed, you may have physical therapy to help improve the range of motion in your shoulder joint. Follow these instructions at home: If you have a splint or sling:  Wear it as told by your health care provider. Remove it only as told by your health care provider.  Loosen it if your fingers become numb and tingle, or if they turn cold and blue.  Keep it clean and dry. Bathing  Do not take baths, swim, or use a hot tub until your health care provider approves. Ask your health care provider if you can take showers. You may only be allowed to take sponge baths for bathing.  If your health care provider approves bathing and showering, cover your splint or sling with a watertight plastic bag to protect it from water. Do not let the splint or sling get wet. Managing pain, stiffness, and swelling  If directed, apply ice to the injured area. ? Put ice in a plastic bag. ? Place a towel between your skin and the bag. ? Leave the ice on for 20 minutes, 2-3 times per day.  Move your fingers often to avoid stiffness and to decrease swelling.  Raise (elevate)  the injured area above the level of your heart while you are sitting or lying down. Driving  Do not drive while wearing a splint or sling on a hand that you use for driving.  Do not drive or operate heavy machinery while taking pain medicine. Activity  Return to your normal activities as told by your health care provider. Ask your health care provider what activities are safe for you.  Perform range-of-motion exercises only as told by your health care provider.  Exercise your hand by squeezing a soft ball. This helps to  decrease stiffness and swelling in your hand and wrist. General instructions  Take over-the-counter and prescription medicines only as told by your health care provider.  Do not use any tobacco products, including cigarettes, chewing tobacco, or e-cigarettes. Tobacco can delay bone and tissue healing. If you need help quitting, ask your health care provider.  Keep all follow-up visits as told by your health care provider. This is important. Contact a health care provider if:  Your splint or sling gets damaged. Get help right away if:  Your pain gets worse rather than better.  You lose feeling in your arm or hand.  Your arm or hand becomes white and cold. This information is not intended to replace advice given to you by your health care provider. Make sure you discuss any questions you have with your health care provider. Document Released: 08/21/2001 Document Revised: 07/22/2016 Document Reviewed: 03/21/2015 Elsevier Interactive Patient Education  2018 [employer]. Recurrent Shoulder Laxity and Instability Shoulder laxity is a condition in which the shoulder joint is loose and it moves more than normal. Shoulder instability means that the shoulder can move slightly out of its socket (subluxation) or move completely out of its socket (dislocation) more easily than usual. The shoulder is a ball-and-socket joint. The rounded part of the upper arm bone (humeral head, or "ball") fits into a cup-like socket in the upper part of the shoulder blade (glenoid). When you have shoulder laxity and instability, the humeral head can slip out of place in a forward, backward, or downward direction. This can happen repeatedly (recurrently). What are the causes? This condition is usually caused by overusing the shoulder, especially if you already have loose bands of tissue (ligaments and tendons) surrounding the shoulder joint (shoulder capsule). What increases the risk? The following factors may make you  more likely to develop this condition:  Having Ehlers-Danlos syndrome or Marfan syndrome.  Injury to the shoulder in the past.  There is also a greater risk of this condition among athletes who participate in:  Swimming.  Sports that involve throwing.  Racquet sports.  Volleyball.  Gymnastics.  What are the signs or symptoms? Shoulder pain is the main symptom of this condition. Pain often gets worse with:  Overhead motions.  Throwing motions.  Carrying or pushing heavy objects.  Other symptoms may include:  Weakness.  Muscles getting tired sooner than normal (early muscle fatigue) with repetitive arm use.  Clicking or popping in the shoulder.  Feeling nervous to move the arm in certain directions (apprehension).  Numbness and tingling in the arm.  Having more range of motion than normal (hypermobility).  How is this diagnosed? This condition may be diagnosed based on:  Your symptoms.  Your medical history.  A physical exam. Your health care provider will move your shoulder, test your strength, and check for hypermobility.  MRI.  How is this treated? Treatment for this condition may include:  Resting your shoulder  and avoiding all activities that cause shoulder pain, such as overhead and throwing motions.  NSAIDs to help reduce pain and swelling.  Physical therapy.  Moving your shoulder back into place (reduction). Your health care provider may do this if your shoulder dislocates and does not move back into place. Before reduction, you may be given an injection of numbing medicine and a medicine to help you relax (sedative).  Surgery. This may be done if nonsurgical treatments do not help. The most common surgery is a procedure to tighten and repair the loose parts of your shoulder joint.  Follow these instructions at home: Managing pain, stiffness, and swelling   Move your fingers often to avoid stiffness and to lessen swelling.  Raise (elevate)  your upper body on pillows when you are lying down or sleeping. Do not sleep in a position that puts pressure on your shoulder. For example: ? Do not sleep on the front of your body (abdomen). ? Do not sleep with your arm over your head.  If directed, put ice on your shoulder. ? Put ice in a plastic bag. ? Place a towel between your skin and the bag. ? Leave the ice on for 20 minutes, 2-3 times a day. Driving  Do not drive or operate heavy machinery while taking prescription pain medicine.  Ask your health care provider when it is safe for you to drive. Activity  Return to your normal activities as told by your health care provider. Ask your health care provider what activities are safe for you.  Do exercises as told by your health care provider. General instructions  Do not use any tobacco products, including cigarettes, chewing tobacco, or e-cigarettes. Tobacco can delay healing. If you need help quitting, ask your health care provider.  Do not use your shoulder actively until your health care provider says that you can.  Take over-the-counter and prescription medicines only as told by your health care provider.  Keep all follow-up visits as told by your health care provider. This is important. How is this prevented?  Give your body time to rest between periods of activity.  Avoid or spend less time doing overhead activities.  Maintain physical fitness, including strength. Contact a health care provider if:  Your symptoms do not improve with treatment.  Your symptoms get worse.  Your shoulder dislocates multiple times. Even if your shoulder moves back into place, you should still seek medical care.  You continue to have pain, weakness, or a feeling of instability after 4 weeks of treatment. Get help right away if:  You shoulder dislocates and does not slip back into the joint. Symptoms of a shoulder joint dislocation may include: ? Deformity of the shoulder. ? Intense  pain. ? Inability to move the shoulder. ? Numbness, weakness, or tingling in your neck or down your arm. ? Bruising or swelling around your shoulder. This information is not intended to replace advice given to you by your health care provider. Make sure you discuss any questions you have with your health care provider. Document Released: 11/26/2005 Document Revised: 07/31/2016 Document Reviewed: 10/29/2015 Elsevier Interactive Patient Education  [employer].

## 2017-09-03 NOTE — Progress Notes (Signed)
He dislocated his shoulder while at work and relocated it himself. He has had difficulty with this in the past requiring surgery. Presently he is having no discomfort. Exam shows normal sensation to his shoulder. X-rays appeared normal. He will get a shoulder sling and set him up for physical therapy. He is aware of the fact that if this continues to dislocate, surgery would be needed.

## 2017-09-03 NOTE — Telephone Encounter (Signed)
Pt called and stated that his work is requesting a letter from you. They need to know what his limitations are. Please fax to ATTN: Crisoforo Oxford at [phone].

## 2017-09-03 NOTE — Progress Notes (Signed)
Subjective:    Patient ID: Gregory Hamilton, male    DOB: 1966/03/23, 51 y.o.   MRN: [id number]  51y/o married established caucasian male here for evaluation right shoulder dislocation during serving at volleyball game at work.  Patient reported last shoulder dislocation 10-12 years ago was having recurrent dislocations, evaluated by orthopedics and had surgery and no further dislocations until today.  Denied tingling/numbness in right arm/hand.  Right hand dominant.  Having a lot of pain on arrival to clinic and requesting assistance to pop it back into place.      Review of Systems  Constitutional: Negative for activity change, appetite change, chills, diaphoresis, fatigue, fever and unexpected weight change.  HENT: Negative for hearing loss, tinnitus, trouble swallowing and voice change.   Eyes: Negative for photophobia and visual disturbance.  Respiratory: Negative for cough, choking, chest tightness, shortness of breath, wheezing and stridor.   Cardiovascular: Negative for chest pain, palpitations and leg swelling.  Gastrointestinal: Negative for nausea and vomiting.  Endocrine: Negative for cold intolerance and heat intolerance.  Genitourinary: Negative for difficulty urinating, dysuria and enuresis.  Musculoskeletal: Positive for arthralgias, joint swelling and myalgias. Negative for back pain, gait problem, neck pain and neck stiffness.  Skin: Negative for color change, pallor, rash and wound.  Allergic/Immunologic: Positive for environmental allergies and immunocompromised state. Negative for food allergies.  Neurological: Negative for dizziness, tremors, seizures, syncope, facial asymmetry, speech difficulty, weakness, light-headedness, numbness and headaches.  Hematological: Negative for adenopathy. Does not bruise/bleed easily.  Psychiatric/Behavioral: Negative for confusion and sleep disturbance.       Objective:   Physical Exam  Constitutional: He is oriented to person,  place, and time. Vital signs are normal. He appears well-developed and well-nourished. He is active and cooperative.  Non-toxic appearance. He does not have a sickly appearance. He does not appear ill. No distress.  HENT:  Head: Normocephalic and atraumatic.  Right Ear: Hearing and external ear normal.  Left Ear: Hearing and external ear normal.  Nose: Nose normal.  Mouth/Throat: Uvula is midline, oropharynx is clear and moist and mucous membranes are normal. No oral lesions. No trismus in the jaw. No uvula swelling or lacerations. No oropharyngeal exudate, posterior oropharyngeal edema or posterior oropharyngeal erythema.  Eyes: Pupils are equal, round, and reactive to light. Conjunctivae, EOM and lids are normal. Right eye exhibits no discharge. Left eye exhibits no discharge. No scleral icterus.  Neck: Trachea normal, normal range of motion and phonation normal. Neck supple. No tracheal tenderness, no spinous process tenderness and no muscular tenderness present. No neck rigidity. No tracheal deviation, no edema, no erythema and normal range of motion present.  Cardiovascular: Normal rate, regular rhythm, normal heart sounds and intact distal pulses.   Pulses:      Radial pulses are 2+ on the right side, and 2+ on the left side.  Pulmonary/Chest: Effort normal and breath sounds normal. No accessory muscle usage or stridor. No respiratory distress. He has no wheezes. He has no rales.  Abdominal: Soft. He exhibits no distension. There is no guarding.  Musculoskeletal: He exhibits edema, tenderness and deformity.       Right shoulder: He exhibits decreased range of motion, tenderness, bony tenderness, swelling, deformity, pain, spasm and decreased strength. He exhibits no effusion, no crepitus, no laceration and normal pulse.       Left shoulder: Normal.       Right elbow: Normal.      Left elbow: Normal.  Right wrist: Normal.       Left wrist: Normal.       Right hip: Normal.       Left  hip: Normal.       Right knee: Normal.       Left knee: Normal.       Cervical back: Normal.       Thoracic back: Normal.       Lumbar back: Normal.       Right upper arm: He exhibits tenderness, bony tenderness, swelling and deformity. He exhibits no edema and no laceration.       Left upper arm: Normal.       Right forearm: Normal.       Left forearm: Normal.       Arms: Patient cradling right arm against chest holding with left hand; right shoulder drooping compared to left and bulge in biceps area, TTP; patient able to move all fingers; capillary refill less than 2 seconds but minimal AROM adduction/abduction greater than 20 degrees right shoulder due to pain; discussed procedure to attempt reduction and patient wanted to try by himself raised right hand and once elbow above nipple level patient reported he felt humerus slide back into place and had improved AROM but still some discomfort denied tingling/numbness; rechecked radial pulse 2+/4 equal bilaterally applied ace wrap for support and ice anterior shoulder.  Lymphadenopathy:    He has no cervical adenopathy.       Right axillary: No lateral adenopathy present.  Neurological: He is alert and oriented to person, place, and time. He is not disoriented. He displays no atrophy and no tremor. No cranial nerve deficit or sensory deficit. He exhibits normal muscle tone. He displays no seizure activity. Coordination and gait normal. GCS eye subscore is 4. GCS verbal subscore is 5. GCS motor subscore is 6.  Bilateral hand grasp equal 5/5; on/off exam table without difficulty; gait sure and steady in hall  Skin: Skin is warm, dry and intact. No abrasion, no bruising, no burn, no ecchymosis, no laceration, no lesion, no petechiae and no rash noted. He is not diaphoretic. No cyanosis or erythema. No pallor. Nails show no clubbing.  Psychiatric: He has a normal mood and affect. His speech is normal and behavior is normal. Judgment and thought content  normal. Cognition and memory are normal.    Given ice pack and wrapped with 2- 4 inch ace bandages for immobilizing discussed someone else to drive patient to [facility] location for right shoulder xray and he is to contact his ortho provider for f/u as PMHx right shoulder dislocations 12 years ago and surgical repair first time thius has reoccurred since surgery  Patient raised right hand and shoulder popped into place good radial pulse 2+/4 and denied numbness/tingling right arm; capillary refill less than 2 seconds right arm and normal AROM elbow/wrist/hand/fingers before and after patient self-maneuvered shoulder back into place.  Shoulder sling not available in clinic nor was [employer] supplies.  1307 reviewed images but radiology report still pending no acute fracture noted.  RN Nance Pew notified.  1800 telephone message left for patient no fracture good alignment continue plan of care as previously discussed follow up ortho/ER if hand/fingers cold/blue/numb.    Assessment & Plan:  A-right shoulder dislocation recurrent, initial visit  P-Patient was instructed to rest, ice (cryotherapy 15 minutes QID), and gentle daily ROM exercises.  Activity as tolerated.  Patient is to take OTC po NSAIDS as needed.  Preferred tylenol   po QID prn pain but if breakthrough pain after rest/ice motrin  po TID prn moderate/severe  Pain patient has at home  To [facility] today for xray right shoulder rule out fracture/continued dislocation on exam appears fully reduced prior to patient departure.  Contact his ortho provider to schedule follow up as history of recurrent dislocations and surgical repair 12 years ago.  Follow up if new symptoms e.g. Right arm/fingers blue/number; shoulder dislocates again and unable to perform home maneuver to have it pop back in, worsening pain not controlled with above measures  Exitcare handout given on shoulder dislocation.  Patient verbalized agreement and  understanding of treatment plan.  P2:  Injury Prevention and F[employer].

## 2017-09-04 ENCOUNTER — Other Ambulatory Visit: Payer: Self-pay | Admitting: Family Medicine

## 2017-09-04 DIAGNOSIS — S43004A Unspecified dislocation of right shoulder joint, initial encounter: Secondary | ICD-10-CM

## 2017-09-04 MED ORDER — TRAMADOL HCL 50 MG PO TABS: 50.0000 mg | ORAL_TABLET | Freq: Three times a day (TID) | ORAL | 0 refills | Status: DC | PRN

## 2017-09-04 NOTE — Telephone Encounter (Signed)
Called in Tramadol per Dr. Susann Givens to [facility]

## 2017-09-04 NOTE — Telephone Encounter (Signed)
Letter was typed and faxed yesterday

## 2017-09-05 ENCOUNTER — Encounter: Payer: Self-pay | Admitting: Internal Medicine

## 2017-09-13 ENCOUNTER — Ambulatory Visit: Payer: No Typology Code available for payment source

## 2017-09-30 ENCOUNTER — Ambulatory Visit: Payer: No Typology Code available for payment source | Attending: Family Medicine

## 2017-09-30 DIAGNOSIS — M25511 Pain in right shoulder: Secondary | ICD-10-CM | POA: Insufficient documentation

## 2017-09-30 NOTE — Therapy (Signed)
[facility] [address] [place], [place], [address] Phone: [phone]   Fax:  [phone]  Patient Details  Name: Gregory AdaJohn D Hamilton MRN: [id number] Date of Birth: 03-Sep-1966 Referring Provider:  Ronnald NianLalonde, Lerry C, MD  Encounter Date: 09/30/2017        Gregory Gregory LankGriffith reports no symptoms since MD visit with Dr Susann GivensLalonde and he had resumed his exercises from previous dislocation events . He reports using arm normally for all work and home tasks. He has some discomfort with hand behind head postures but has stopped that.   He is using blue thera band for exercises at   home so I issued a black band and some [place] exercises and cautioned him to not move to extremes of motion for exercise.  He feels he can manage at home. I suggested if he  has a set back then he could return for exercise progression  and pain control as needed.               Caprice RedChasse, Gregory Hamilton  PT 09/30/2017, 9:11 AM  [facility] [address] [place], [place], [address] Phone: [phone]   Fax:  [phone]

## 2017-11-19 ENCOUNTER — Ambulatory Visit: Payer: Self-pay | Admitting: Registered Nurse

## 2017-11-19 VITALS — BP 113/86 | HR 100 | Temp 99.0°F

## 2017-11-19 DIAGNOSIS — W010XXA Fall on same level from slipping, tripping and stumbling without subsequent striking against object, initial encounter: Secondary | ICD-10-CM

## 2017-11-19 DIAGNOSIS — S060X0A Concussion without loss of consciousness, initial encounter: Secondary | ICD-10-CM

## 2017-11-19 DIAGNOSIS — S161XXA Strain of muscle, fascia and tendon at neck level, initial encounter: Secondary | ICD-10-CM

## 2017-11-19 MED ORDER — ACETAMINOPHEN 500 MG PO TABS: 1000.0000 mg | ORAL_TABLET | Freq: Four times a day (QID) | ORAL | 0 refills | Status: AC | PRN

## 2017-11-19 MED ORDER — MENTHOL (TOPICAL ANALGESIC) 4 % EX GEL: 1.0000 "application " | Freq: Four times a day (QID) | CUTANEOUS | 0 refills | Status: AC | PRN

## 2017-11-19 MED ORDER — CYCLOBENZAPRINE HCL 10 MG PO TABS: 5.0000 mg | ORAL_TABLET | Freq: Three times a day (TID) | ORAL | 0 refills | Status: AC | PRN

## 2017-11-19 NOTE — Patient Instructions (Signed)
Cervical Strain and Sprain Rehab Ask your health care provider which exercises are safe for you. Do exercises exactly as told by your health care provider and adjust them as directed. It is normal to feel mild stretching, pulling, tightness, or discomfort as you do these exercises, but you should stop right away if you feel sudden pain or your pain gets worse.Do not begin these exercises until told by your health care provider. Stretching and range of motion exercises These exercises warm up your muscles and joints and improve the movement and flexibility of your neck. These exercises also help to relieve pain, numbness, and tingling. Exercise A: Cervical side bend  1. Using good posture, sit on a stable chair or stand up. 2. Without moving your shoulders, slowly tilt your left / right ear to your shoulder until you feel a stretch in your neck muscles. You should be looking straight ahead. 3. Hold for __________ seconds. 4. Repeat with the other side of your neck. Repeat __________ times. Complete this exercise __________ times a day. Exercise B: Cervical rotation  1. Using good posture, sit on a stable chair or stand up. 2. Slowly turn your head to the side as if you are looking over your left / right shoulder. ? Keep your eyes level with the ground. ? Stop when you feel a stretch along the side and the back of your neck. 3. Hold for __________ seconds. 4. Repeat this by turning to your other side. Repeat __________ times. Complete this exercise __________ times a day. Exercise C: Thoracic extension and pectoral stretch 1. Roll a towel or a small blanket so it is about 4 inches (10 cm) in diameter. 2. Lie down on your back on a firm surface. 3. Put the towel lengthwise, under your spine in the middle of your back. It should not be not under your shoulder blades. The towel should line up with your spine from your middle back to your lower back. 4. Put your hands behind your head and let your  elbows fall out to your sides. 5. Hold for __________ seconds. Repeat __________ times. Complete this exercise __________ times a day. Strengthening exercises These exercises build strength and endurance in your neck. Endurance is the ability to use your muscles for a long time, even after your muscles get tired. Exercise D: Upper cervical flexion, isometric 1. Lie on your back with a thin pillow behind your head and a small rolled-up towel under your neck. 2. Gently tuck your chin toward your chest and nod your head down to look toward your feet. Do not lift your head off the pillow. 3. Hold for __________ seconds. 4. Release the tension slowly. Relax your neck muscles completely before you repeat this exercise. Repeat __________ times. Complete this exercise __________ times a day. Exercise E: Cervical extension, isometric  1. Stand about 6 inches (15 cm) away from a wall, with your back facing the wall. 2. Place a soft object, about 6-8 inches (15-20 cm) in diameter, between the back of your head and the wall. A soft object could be a small pillow, a ball, or a folded towel. 3. Gently tilt your head back and press into the soft object. Keep your jaw and forehead relaxed. 4. Hold for __________ seconds. 5. Release the tension slowly. Relax your neck muscles completely before you repeat this exercise. Repeat __________ times. Complete this exercise __________ times a day. Posture and body mechanics  Body mechanics refers to the movements and positions of   your body while you do your daily activities. Posture is part of body mechanics. Good posture and healthy body mechanics can help to relieve stress in your body's tissues and joints. Good posture means that your spine is in its natural S-curve position (your spine is neutral), your shoulders are pulled back slightly, and your head is not tipped forward. The following are general guidelines for applying improved posture and body mechanics to  your everyday activities. Standing  When standing, keep your spine neutral and keep your feet about hip-width apart. Keep a slight bend in your knees. Your ears, shoulders, and hips should line up.  When you do a task in which you stand in one place for a long time, place one foot up on a stable object that is 2-4 inches (5-10 cm) high, such as a footstool. This helps keep your spine neutral. Sitting   When sitting, keep your spine neutral and your keep feet flat on the floor. Use a footrest, if necessary, and keep your thighs parallel to the floor. Avoid rounding your shoulders, and avoid tilting your head forward.  When working at a desk or a computer, keep your desk at a height where your hands are slightly lower than your elbows. Slide your chair under your desk so you are close enough to maintain good posture.  When working at a computer, place your monitor at a height where you are looking straight ahead and you do not have to tilt your head forward or downward to look at the screen. Resting When lying down and resting, avoid positions that are most painful for you. Try to support your neck in a neutral position. You can use a contour pillow or a small rolled-up towel. Your pillow should support your neck but not push on it. This information is not intended to replace advice given to you by your health care provider. Make sure you discuss any questions you have with your health care provider. Document Released: 11/26/2005 Document Revised: 08/02/2016 Document Reviewed: 11/02/2015 [employer] Interactive Patient Education  2018 [employer]. Concussion, Adult A concussion is a brain injury from a direct hit (blow) to the head or body. This blow causes the brain to shake quickly back and forth inside the skull. This can damage brain cells and cause chemical changes in the brain. A concussion may also be known as a mild traumatic brain injury (TBI). Concussions are usually not life-threatening,  but the effects of a concussion can be serious. If you have a concussion, you are more likely to experience concussion-like symptoms after a direct blow to the head in the future. What are the causes? This condition is caused by:  A direct blow to the head, such as from running into another player during a game, being hit in a fight, or hitting your head on a hard surface.  A jolt of the head or neck that causes the brain to move back and forth inside the skull, such as in a car crash.  What are the signs or symptoms? The signs of a concussion can be hard to notice. Early on, they may be missed by you, family members, and health care providers. You may look fine but act or feel differently. Symptoms are usually temporary, but they may last for days, weeks, or even longer. Some symptoms may appear right away but other symptoms may not show up for hours or days. Every head injury is different. Symptoms may include:  Headaches. This can include a feeling  of pressure in the head.  Memory problems.  Trouble concentrating, organizing, or making decisions.  Slowness in thinking, acting or reacting, speaking, or reading.  Confusion.  Fatigue.  Changes in eating or sleeping patterns.  Problems with coordination or balance.  Nausea or vomiting.  Numbness or tingling.  Sensitivity to light or noise.  Vision or hearing problems.  Reduced sense of smell.  Irritability or mood changes.  Dizziness.  Lack of motivation.  Seeing or hearing things that other people do not see or hear (hallucinations).  How is this diagnosed? This condition is diagnosed based on:  Your symptoms.  A description of your injury.  You may also have tests, including:  Imaging tests, such as a CT scan or MRI. These are done to look for signs of brain injury.  Neuropsychological tests. These measure your thinking, understanding, learning, and remembering abilities.  How is this treated? This  condition is treated with physical and mental rest and careful observation, usually at home. If the concussion is severe, you may need to stay home from work for a while. You may be referred to a concussion clinic or to other health care providers for management. It is important that you tell your health care provider if:  You are taking any medicines, including prescription medicines, over-the-counter medicines, and natural remedies. Some medicines, such as blood thinners (anticoagulants) and aspirin, may increase the chance of complications, such as bleeding.  You are taking or have taken alcohol or illegal drugs. Alcohol and certain other drugs may slow your recovery and can put you at risk of further injury.  How fast you will recover from a concussion depends on many factors, such as how severe your concussion is, what part of your brain was injured, how old you are, and how healthy you were before the concussion. Recovery can take time. It is important to wait to return to activity until a health care provider says it is safe to do that and your symptoms are completely gone. Follow these instructions at home: Activity  Limit activities that require a lot of thought or concentration. These may include: ? Doing homework or job-related work. ? Watching TV. ? Working on the computer. ? Playing memory games and puzzles.  Rest. Rest helps the brain to heal. Make sure you: ? Get plenty of sleep at night. Avoid staying up late at night. ? Keep the same bedtime hours on weekends and weekdays. ? Rest during the day. Take naps or rest breaks when you feel tired.  Having another concussion before the first one has healed can be dangerous. Do not do high-risk activities that could cause a second concussion, such as riding a bicycle or playing sports.  Ask your health care provider when you can return to your normal activities, such as school, work, athletics, driving, riding a bicycle, or using heavy  machinery. Your ability to react may be slower after a brain injury. Never do these activities if you are dizzy. Your health care provider will likely give you a plan for gradually returning to activities. General instructions  Take over-the-counter and prescription medicines only as told by your health care provider.  Do not drink alcohol until your health care provider says you can.  If it is harder than usual to remember things, write them down.  If you are easily distracted, try to do one thing at a time. For example, do not try to watch TV while fixing dinner.  Talk with family members or  close friends when making important decisions.  Watch your symptoms and tell others to do the same. Complications sometimes occur after a concussion. Older adults with a brain injury may have a higher risk of serious complications, such as a blood clot in the brain.  Tell your teachers, school nurse, school counselor, coach, athletic trainer, or work Production designer, theatre/television/filmmanager about your injury, symptoms, and restrictions. Tell them about what you can or cannot do. They should watch for: ? Increased problems with attention or concentration. ? Increased difficulty remembering or learning new information. ? Increased time needed to complete tasks or assignments. ? Increased irritability or decreased ability to cope with stress. ? Increased symptoms.  Keep all follow-up visits as told by your health care provider. This is important. How is this prevented? It is very important to avoid another brain injury, especially as you recover. In rare cases, another injury can lead to permanent brain damage, brain swelling, or death. The risk of this is greatest during the first 7-10 days after a head injury. Avoid injuries by:  Wearing a seat belt when riding in a car.  Wearing a helmet when biking, skiing, skateboarding, skating, or doing similar activities.  Avoiding activities that could lead to a second concussion, such as  contact or recreational sports, until your health care provider says it is okay.  Taking safety measures in your home, such as: ? Removing clutter and tripping hazards from floors and stairways. ? Using grab bars in bathrooms and handrails by stairs. ? Placing non-slip mats on floors and in bathtubs. ? Improving lighting in dim areas.  Contact a health care provider if:  Your symptoms get worse.  You have new symptoms.  You continue to have symptoms for more than 2 weeks. Get help right away if:  You have severe or worsening headaches.  You have weakness or numbness in any part of your body.  Your coordination gets worse.  You vomit repeatedly.  You are sleepier.  The pupil of one eye is larger than the other.  You have convulsions or a seizure.  Your speech is slurred.  Your fatigue, confusion, or irritability gets worse.  You cannot recognize people or places.  You have neck pain.  It is difficult to wake you up.  You have unusual behavior changes.  You lose consciousness. Summary  A concussion is a brain injury from a direct hit (blow) to the head or body.  A concussion may also be called a mild traumatic brain injury (TBI).  You may have imaging tests and neuropsychological tests to diagnose a concussion.  This condition is treated with physical and mental rest and careful observation.  Ask your health care provider when you can return to your normal activities, such as school, work, athletics, driving, riding a bicycle, or using heavy machinery. Follow safety instructions as told by your health care provider. This information is not intended to replace advice given to you by your health care provider. Make sure you discuss any questions you have with your health care provider. Document Released: 02/16/2004 Document Revised: 11/06/2016 Document Reviewed: 11/06/2016 [employer] Interactive Patient Education  2017 [employer]

## 2017-11-19 NOTE — Progress Notes (Signed)
Subjective:    Patient ID: Gregory Hamilton, male    DOB: 12-28-1965, 51 y.o.   MRN: [id number]  51y/o caucasian male established Pt reports falling in parking lot at work this morning. Fell backwards hitting head on snow, landing on back. Felt fine until about 30 min ago. Began having nausea. Denies vomiting, dizziness, lightheadedness. Scalp tender to touch back of head but denied bleeding/swelling. Headache 5/10. Neck and Back pain between shoulder blades, 2/10. Has not taken any pain relievers today. Has used flexeril in the past with good relief muscle spasms ran out would like refill.  Used all diclofenac also cannot remember if it worked better than tylenol or motrin in the past.      Review of Systems  Constitutional: Negative for activity change, appetite change, chills, diaphoresis, fatigue and fever.  HENT: Negative for congestion, dental problem, drooling, ear discharge, ear pain, facial swelling, hearing loss, mouth sores, nosebleeds, postnasal drip, rhinorrhea, sinus pressure, sinus pain, sneezing, sore throat, tinnitus, trouble swallowing and voice change.   Eyes: Negative for photophobia, pain, discharge, redness, itching and visual disturbance.  Respiratory: Negative for cough, choking, shortness of breath, wheezing and stridor.   Cardiovascular: Negative for chest pain and palpitations.  Gastrointestinal: Positive for nausea. Negative for abdominal distention, abdominal pain, anal bleeding, blood in stool, diarrhea and vomiting.  Endocrine: Negative for cold intolerance and heat intolerance.  Genitourinary: Negative for difficulty urinating and dysuria.  Musculoskeletal: Positive for back pain, myalgias and neck pain. Negative for arthralgias, gait problem, joint swelling and neck stiffness.  Skin: Negative for color change, pallor, rash and wound.  Allergic/Immunologic: Positive for environmental allergies and immunocompromised state. Negative for food allergies.   Neurological: Positive for headaches. Negative for dizziness, tremors, seizures, syncope, facial asymmetry, speech difficulty, weakness, light-headedness and numbness.  Hematological: Negative for adenopathy. Does not bruise/bleed easily.  Psychiatric/Behavioral: Negative for agitation, confusion and sleep disturbance.       Objective:   Physical Exam  Constitutional: He is oriented to person, place, and time. Vital signs are normal. He appears well-developed and well-nourished. He is active and cooperative.  Non-toxic appearance. He does not have a sickly appearance. He does not appear ill. No distress.  HENT:  Head: Normocephalic and atraumatic. Head is without raccoon's eyes, without Battle's sign, without abrasion, without contusion, without laceration, without right periorbital erythema and without left periorbital erythema. Hair is normal.  Right Ear: Hearing, tympanic membrane, external ear and ear canal normal.  Left Ear: Hearing, tympanic membrane, external ear and ear canal normal.  Nose: Nose normal.  Mouth/Throat: Uvula is midline and oropharynx is clear and moist. Mucous membranes are not pale, dry and not cyanotic. He does not have dentures. No oral lesions. No trismus in the jaw. Normal dentition. No dental abscesses, uvula swelling, lacerations or dental caries. No oropharyngeal exudate, posterior oropharyngeal edema or posterior oropharyngeal erythema.  Tongue dry and orange discoloration from eating candy prior to appt  Eyes: Conjunctivae, EOM and lids are normal. Pupils are equal, round, and reactive to light. Right eye exhibits no discharge. Left eye exhibits no discharge. No scleral icterus.  Neck: Trachea normal and phonation normal. Neck supple. Muscular tenderness present. No tracheal tenderness and no spinous process tenderness present. No neck rigidity. Decreased range of motion present. No tracheal deviation, no edema and no erythema present.    Bilateral trapezius  tight and paraspinals cervical; most painful occipital and C6 paraspinals; unable to touch chin to chest due to pain and  muscle tightness; decreased arom rotation/flexion/extension due to pain and muscle tightness  Cardiovascular: Normal rate, regular rhythm, normal heart sounds and intact distal pulses.  Pulses:      Radial pulses are 2+ on the right side, and 2+ on the left side.  Pulmonary/Chest: Effort normal and breath sounds normal. No accessory muscle usage or stridor. No respiratory distress. He has no decreased breath sounds. He has no wheezes. He has no rhonchi. He has no rales.  Abdominal: Soft. Normal appearance. He exhibits no distension, no fluid wave and no ascites. There is no rigidity and no guarding.  Musculoskeletal: He exhibits tenderness. He exhibits no edema or deformity.       Right shoulder: Normal.       Left shoulder: Normal.       Right elbow: Normal.      Left elbow: Normal.       Right hip: Normal.       Left hip: Normal.       Right knee: Normal.       Left knee: Normal.       Right ankle: Normal.       Left ankle: Normal.       Cervical back: He exhibits decreased range of motion, tenderness, pain and spasm. He exhibits no bony tenderness, no swelling, no edema, no deformity, no laceration and normal pulse.       Thoracic back: He exhibits decreased range of motion, tenderness, pain and spasm. He exhibits no bony tenderness, no swelling, no edema, no deformity, no laceration and normal pulse.       Lumbar back: He exhibits normal range of motion, no tenderness, no bony tenderness, no swelling, no edema, no deformity, no laceration, no pain, no spasm and normal pulse.       Back:       Right hand: Normal.       Left hand: Normal.  Paraspinals T1-T4 tight and TTP bilaterally  Lymphadenopathy:       Head (right side): No submental, no submandibular, no tonsillar, no preauricular, no posterior auricular and no occipital adenopathy present.       Head (left side):  No submental, no submandibular, no tonsillar, no preauricular, no posterior auricular and no occipital adenopathy present.    He has no cervical adenopathy.       Right cervical: No superficial cervical, no deep cervical and no posterior cervical adenopathy present.      Left cervical: No superficial cervical, no deep cervical and no posterior cervical adenopathy present.  Neurological: He is alert and oriented to person, place, and time. He has normal strength. He is not disoriented. He displays no atrophy, no tremor and normal reflexes. No cranial nerve deficit or sensory deficit. He exhibits normal muscle tone. He displays no seizure activity. Coordination and gait normal. GCS eye subscore is 4. GCS verbal subscore is 5. GCS motor subscore is 6.  Reflex Scores:      Brachioradialis reflexes are 2+ on the right side and 2+ on the left side.      Patellar reflexes are 2+ on the right side and 2+ on the left side.      Achilles reflexes are 2+ on the right side and 2+ on the left side. On/off exam table; in/out of chair without difficulty; gait sure and steady in hallway; upper/lower extremity strength equal 5/5 bilaterally  Skin: Skin is warm, dry and intact. No abrasion, no bruising, no burn, no ecchymosis, no laceration, no  lesion, no petechiae and no rash noted. He is not diaphoretic. No cyanosis or erythema. No pallor. Nails show no clubbing.  Psychiatric: He has a normal mood and affect. His speech is normal and behavior is normal. Judgment and thought content normal. Cognition and memory are normal.  Nursing note and vitals reviewed.         Assessment & Plan:  A-slipped fall without loss of consciousness, initial encounter, neck strain initial encounter, mild concussion initial encounter  P-cyclobenazeprine/flexeril 10mg  take 0.5-1 tab po TID prn muscle spasms #30 RF0 dispensed from [facility]. Tylenol 1000mg  po QID prn pain given 8 UD from clinic stock.   Biofreeze apply topical prn pain QID  8 UD given to patient from clinic stock. Avoid alcohol intake and driving while taking cyclobenazeprine/flexeril as drowsiness common side effect.  Slow position changes as medication also lower blood pressure. Discussed will hold motrin and diclofenac at this time possible interaction with genvoya nephrotoxicity. Home stretches demonstrated to patient-e.g. Arm circles, walking up wall, chest stretches, neck AROM, chin tucks, knee to chest and rock side to side on back. Self massage or professional prn, foam roller use or tennis/racquetball.  Heat/cryotherapy 15 minutes QID prn.  Trial thermacare 2 given from clinic stock.  Patient did not want to apply at this time  Consider physical therapy referral if no improvement [facility] and/or [facility]ractic care.  Ensure ergonomics correct desk at work avoid repetitive motions if possible/holding phone/laptop in hand use desk/stand and/or break up lifting items into smaller loads/weights.  Patient was instructed to rest, ice, and ROM exercises. Has reusable ice/heat pack at home. Activity as tolerated.   Follow up if symptoms persist or worsen especially if loss of bowel/bladder control, arm/leg weakness and/or saddle paresthesias go to ER for same day evaluation.  Exitcare handout on cervical strain and rehab exercises given to patient.  Patient verbalized agreement and understanding of treatment plan and had no further questions at this time.  P2:  Injury Prevention and Fitness.  Get lots of rest, get enough sleep. Keep same bedtime weekends and weekdays. Take daytime naps or rest breaks when you feels fatigued or tired, worsening symptoms. Limit physical activity as well as activities that required a lot of thinking or concentration e.g.sports, weight training, running, exercising, heavy lifting; thinking or concentration e.g. classwork or job related. Red flags: headaches/neck pain that continue to worsen, seizures, drowsiness can't be awakened,  repeated vomiting, slurred speech, cant recognize people or places, increasing confusion, weakness or numbness in arms or legs, unusual behavior change, increasing irritability, loss of consciousness, loss of bowel/bladder control, saddle paresthesias.  Drink lots of fluids and eat carbs/protein to maintain blood glucose levels. As symptoms decrease you may begin to gradually return to your daily activities. If symptoms worsen or return lessen activities then try again to advance gradually. During recovery normal to feel frustrated and sad when you do not feel right and can't be as active as usual.  Exitcare handout on concussion adult given to patient. Patient verbalized understanding of information/instructions, agreed with plan of care and had no further questions at this time.

## 2017-12-06 ENCOUNTER — Other Ambulatory Visit: Payer: PRIVATE HEALTH INSURANCE

## 2018-01-06 ENCOUNTER — Telehealth: Payer: Self-pay | Admitting: Family Medicine

## 2018-01-06 NOTE — Telephone Encounter (Signed)
Gregory Hamilton called and states he needs a letter to cover his shoulder immobilizer from September.  Send to Benefits Attn:  [name] fax [phone]   Letter sent

## 2018-04-11 IMAGING — DX DG SHOULDER 2+V*R*
2 series · 2 of 2 positions shown · non-contrast
Comparison: No recent prior.

CLINICAL DATA: Right shoulder dislocation .

EXAM:
RIGHT SHOULDER - 2+ VIEW

[shoulder grashey]
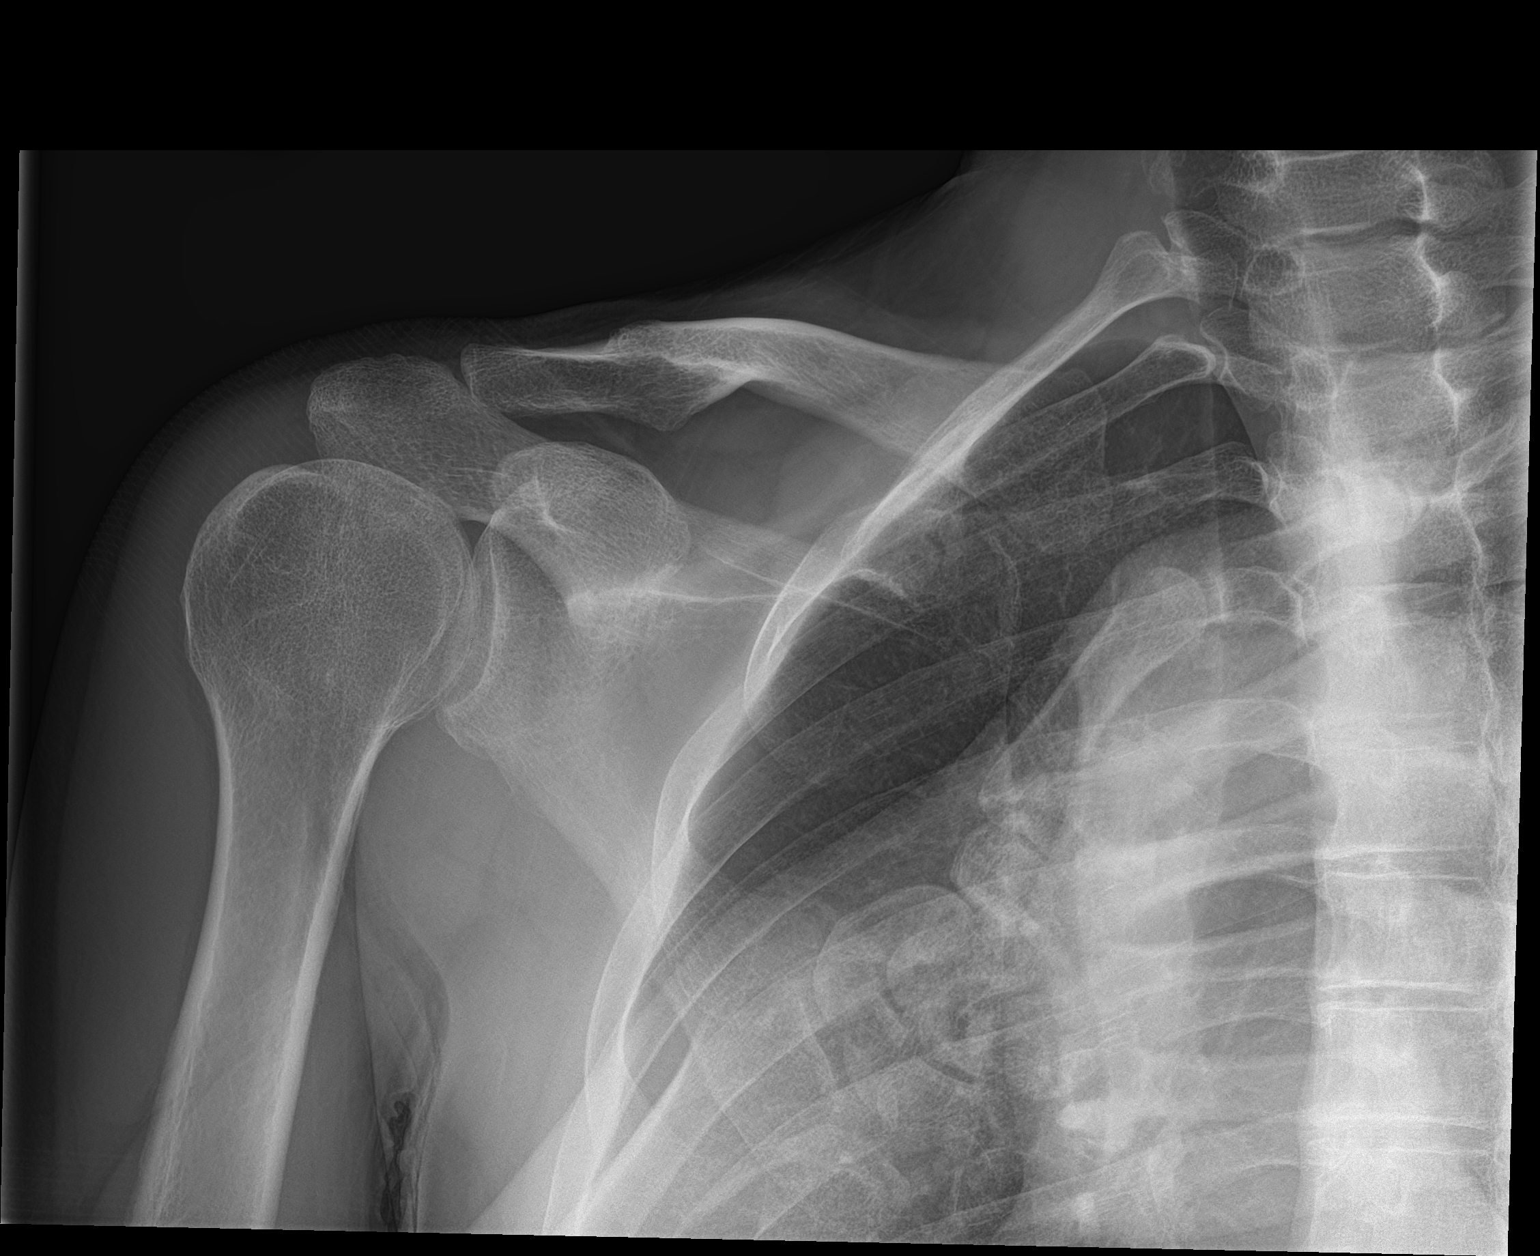

[shoulder y-view]
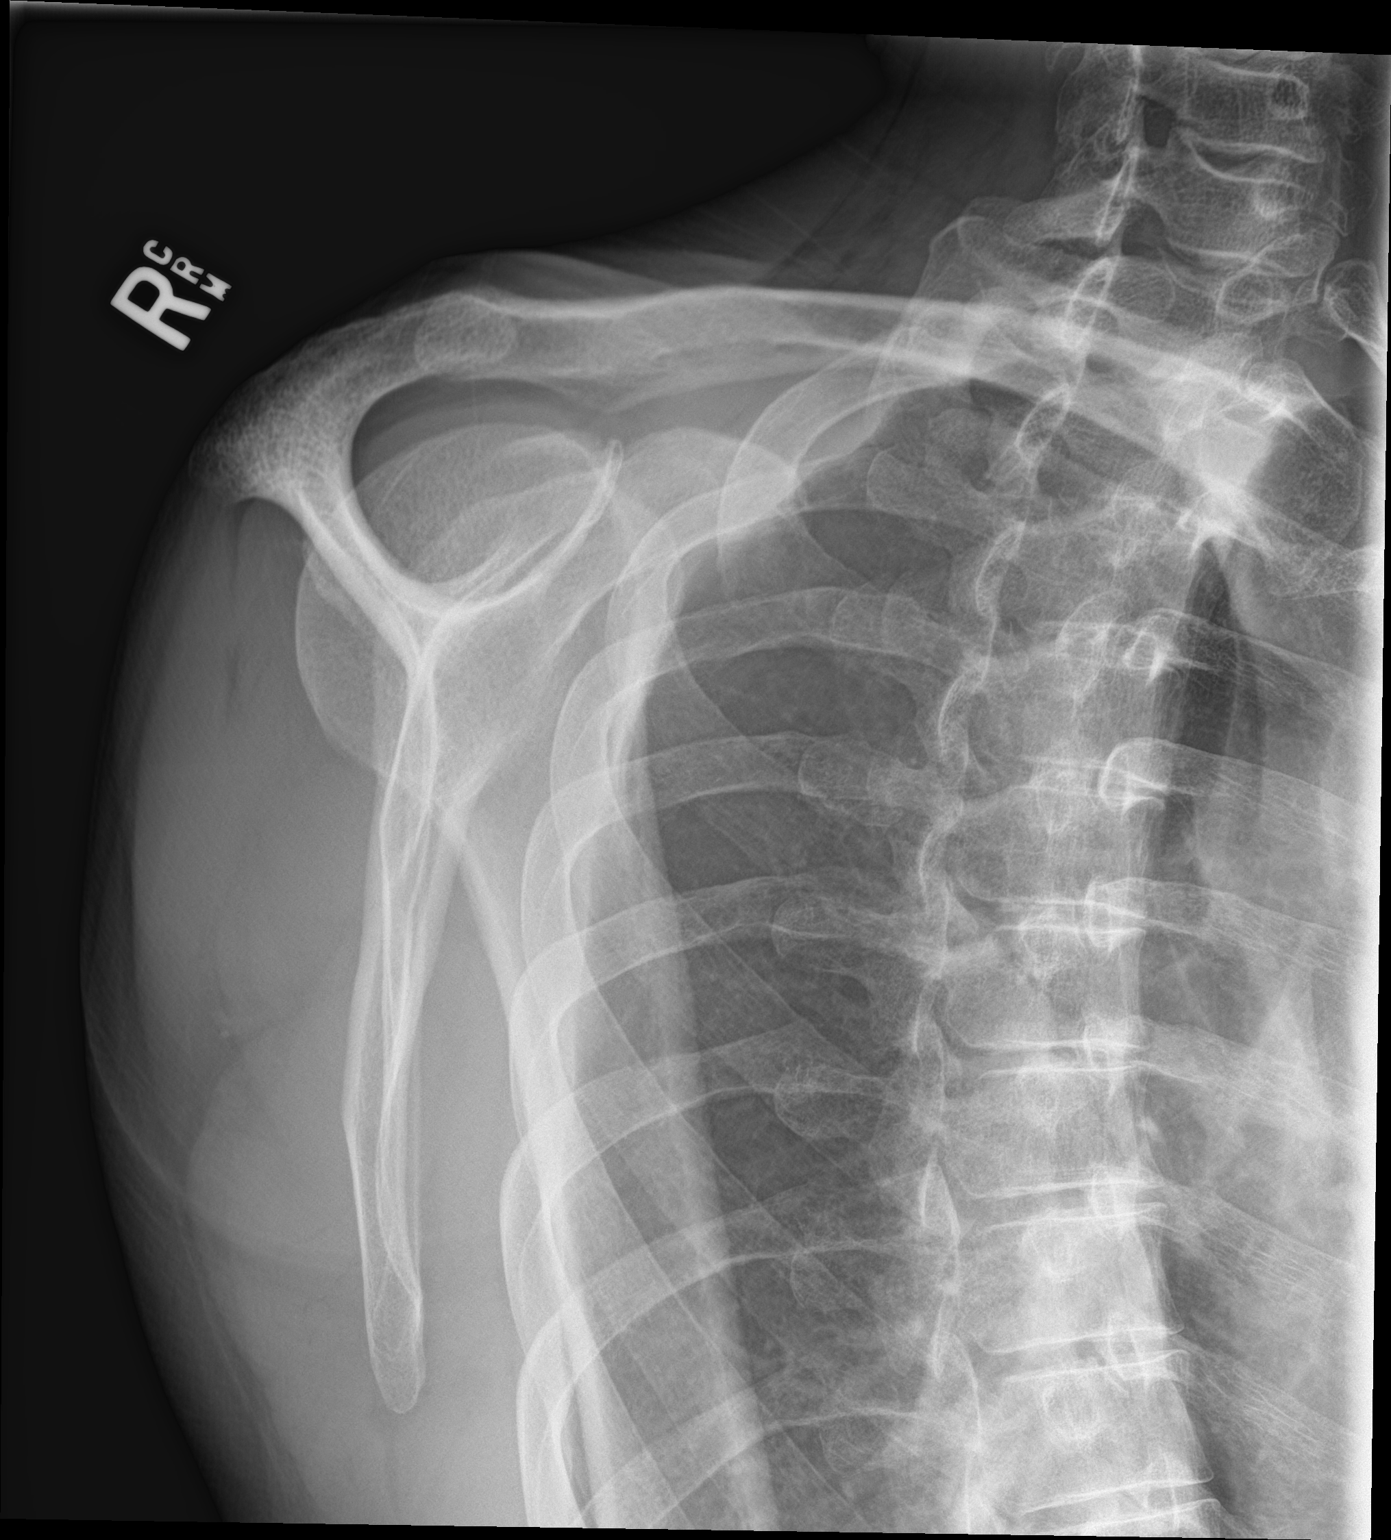

[2 of 2 positions shown; findings below may reference images not displayed]

FINDINGS: No acute bony or joint abnormality identified. No evidence of
fracture or dislocation.
IMPRESSION: No acute bony or joint abnormality identified .

## 2018-10-02 ENCOUNTER — Encounter: Payer: Self-pay | Admitting: Registered Nurse

## 2018-10-02 ENCOUNTER — Ambulatory Visit: Payer: Self-pay | Admitting: Registered Nurse

## 2018-10-02 VITALS — BP 110/88 | HR 96 | Temp 98.7°F

## 2018-10-02 DIAGNOSIS — J019 Acute sinusitis, unspecified: Secondary | ICD-10-CM

## 2018-10-02 MED ORDER — SALINE SPRAY 0.65 % NA SOLN: 2.0000 | NASAL | 0 refills | Status: DC

## 2018-10-02 MED ORDER — ACETAMINOPHEN 500 MG PO TABS: 1000.0000 mg | ORAL_TABLET | Freq: Four times a day (QID) | ORAL | 0 refills | Status: AC | PRN

## 2018-10-02 MED ORDER — PHENYLEPHRINE HCL 10 MG PO TABS: 10.0000 mg | ORAL_TABLET | Freq: Four times a day (QID) | ORAL | Status: DC | PRN

## 2018-10-02 MED ORDER — AZITHROMYCIN 250 MG PO TABS: ORAL_TABLET | ORAL | 0 refills | Status: DC

## 2018-10-02 NOTE — Progress Notes (Signed)
Subjective:    Patient ID: Gregory Hamilton, male    DOB: [birth year], 52 y.o.   MRN: [id number]  52y/o Caucasian established male pt c/o sore throat, nasal congestion, maxillary sinus pressure,pressure behind eyes, PND since last night. Woke up with sx worse this morning. Denies dental pain. Denies otalgia, cough, chest congestion. 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 No meds/home treatment for sx yet. Stopped using Flonase per pcp recommendation due to corticosteroid interaction with Genvoya that can cause Cushing's with long term use.   Last sinusitis 2017 azithromycin, flonase, saline  He hasn't restarted OTC cough and cold yet typically works well and hasn't required seasonal allergy medication this year + work contacts viral URI     Review of Systems  Constitutional: Negative for activity change, appetite change, chills, diaphoresis, fatigue, fever and unexpected weight change.  HENT: Positive for congestion, postnasal drip, rhinorrhea, sinus pressure, sinus pain and sore throat. Negative for dental problem, drooling, ear discharge, ear pain, facial swelling, hearing loss, mouth sores, nosebleeds, sneezing, tinnitus, trouble swallowing and voice change.   Eyes: Negative for photophobia, pain, discharge, redness, itching and visual disturbance.  Respiratory: Negative for cough, choking, chest tightness, shortness of breath, wheezing and stridor.   Cardiovascular: Negative for chest pain, palpitations and leg swelling.  Gastrointestinal: Negative for abdominal distention, abdominal pain, blood in stool, constipation, diarrhea, nausea and vomiting.  Endocrine: Negative for cold intolerance and heat intolerance.  Genitourinary: Negative for dysuria.  Musculoskeletal: Negative for arthralgias, back pain, gait problem, joint swelling, myalgias, neck pain and neck stiffness.  Skin: Negative for color change, pallor, rash and wound.  Allergic/Immunologic: Negative for environmental allergies,  food allergies and immunocompromised state.  Neurological: Negative for dizziness, tremors, seizures, syncope, facial asymmetry, speech difficulty, weakness, light-headedness, numbness and headaches.  Hematological: Negative for adenopathy. Does not bruise/bleed easily.  Psychiatric/Behavioral: Negative for agitation, behavioral problems, confusion and sleep disturbance.       Objective:   Physical Exam  Constitutional: He is oriented to person, place, and time. Vital signs are normal. He appears well-developed and well-nourished. He is active and cooperative.  Non-toxic appearance. He does not have a sickly appearance. He appears ill. No distress.  HENT:  Head: Normocephalic and atraumatic.  Right Ear: Hearing, external ear and ear canal normal. A middle ear effusion is present.  Left Ear: Hearing, external ear and ear canal normal. A middle ear effusion is present.  Nose: Mucosal edema and rhinorrhea present. No nose lacerations, sinus tenderness, nasal deformity, septal deviation or nasal septal hematoma. No epistaxis.  No foreign bodies. Right sinus exhibits maxillary sinus tenderness. Right sinus exhibits no frontal sinus tenderness. Left sinus exhibits maxillary sinus tenderness. Left sinus exhibits no frontal sinus tenderness.  Mouth/Throat: Uvula is midline and mucous membranes are normal. Mucous membranes are not pale, not dry and not cyanotic. He does not have dentures. No oral lesions. No trismus in the jaw. Normal dentition. No dental abscesses, uvula swelling, lacerations or dental caries. Posterior oropharyngeal edema and posterior oropharyngeal erythema present. No oropharyngeal exudate or tonsillar abscesses. Tonsils are 0 on the right. Tonsils are 0 on the left. No tonsillar exudate.  Bilateral allergic shiners; frequent nasal clearing; cobblestoning posterior pharynx; bilateral TMs air fluid level clear; nasal turbinates edema erythema clear discharge  Eyes: Pupils are equal,  round, and reactive to light. Conjunctivae, EOM and lids are normal. Right eye exhibits no chemosis, no discharge, no exudate and no hordeolum. No foreign body present in the right eye. Left  eye exhibits no chemosis, no discharge, no exudate and no hordeolum. No foreign body present in the left eye. Right conjunctiva is not injected. Right conjunctiva has no hemorrhage. Left conjunctiva is not injected. Left conjunctiva has no hemorrhage. No scleral icterus. Right eye exhibits normal extraocular motion and no nystagmus. Left eye exhibits normal extraocular motion and no nystagmus. Right pupil is round and reactive. Left pupil is round and reactive. Pupils are equal.  Neck: Trachea normal, normal range of motion and phonation normal. Neck supple. No tracheal tenderness, no spinous process tenderness and no muscular tenderness present. No neck rigidity. No tracheal deviation, no edema, no erythema and normal range of motion present. No thyroid mass and no thyromegaly present.  Cardiovascular: Normal rate, regular rhythm, S1 normal, S2 normal, normal heart sounds and intact distal pulses. PMI is not displaced. Exam reveals no gallop and no friction rub.  No murmur heard. Pulmonary/Chest: Effort normal and breath sounds normal. No stridor. No respiratory distress. He has no decreased breath sounds. He has no wheezes. He has no rhonchi. He has no rales.  No cough observed in exam room; spoke full sentences without difficulty  Abdominal: Soft. Normal appearance. He exhibits no distension, no fluid wave and no ascites. There is no rigidity and no guarding.  Musculoskeletal: Normal range of motion. He exhibits no edema or tenderness.       Right shoulder: Normal.       Left shoulder: Normal.       Right elbow: Normal.      Left elbow: Normal.       Right hip: Normal.       Left hip: Normal.       Right knee: Normal.       Left knee: Normal.       Cervical back: Normal.       Thoracic back: Normal.        Lumbar back: Normal.       Right hand: Normal.       Left hand: Normal.  Lymphadenopathy:       Head (right side): No submental, no submandibular, no tonsillar, no preauricular, no posterior auricular and no occipital adenopathy present.       Head (left side): No submental, no submandibular, no tonsillar, no preauricular, no posterior auricular and no occipital adenopathy present.    He has no cervical adenopathy.       Right cervical: No superficial cervical, no deep cervical and no posterior cervical adenopathy present.      Left cervical: No superficial cervical, no deep cervical and no posterior cervical adenopathy present.  Neurological: He is alert and oriented to person, place, and time. He has normal strength. He is not disoriented. He displays no atrophy and no tremor. No cranial nerve deficit or sensory deficit. He exhibits normal muscle tone. He displays no seizure activity. Coordination and gait normal. GCS eye subscore is 4. GCS verbal subscore is 5. GCS motor subscore is 6.  On/off exam table and in/out of chair without difficulty; gait sure and steady in hallway  Skin: Skin is warm, dry and intact. Capillary refill takes less than 2 seconds. No abrasion, no bruising, no burn, no ecchymosis, no laceration, no lesion, no petechiae and no rash noted. He is not diaphoretic. No cyanosis or erythema. No pallor. Nails show no clubbing.  Psychiatric: He has a normal mood and affect. His speech is normal and behavior is normal. Judgment and thought content normal. He is not actively  hallucinating. Cognition and memory are normal. He is attentive.  Nursing note and vitals reviewed.         Assessment & Plan:  A-acute rhinosinusitis  P-patient refused flonase, prednisone taper, and allergic to penicillin.  Azithromycin 250mg  sig t2 po day 1 then t1 po daily days 2-5 #60 RF0 dispensed from [facility] to patient. Nasal saline spray 2 sprays each nostril q2h wa prn congestion given 1 bottle from  clinic stock.  If no improvement with 48 hours of saline and flonase use start axithromycin especially if opaque mucous and fever/teeth pain.  Discussed viral sinusitis clear to yellow than green clear mucous  Tylenol 1000mg  po qid prn pain/fever.  Shower BID especially prior to bed. No evidence of systemic bacterial infection, non toxic and well hydrated.  I do not see where any further testing or imaging is necessary at this time.   I will suggest supportive care, rest, good hygiene and encourage the patient to take adequate fluids.  The patient is to return to clinic or EMERGENCY ROOM if symptoms worsen or change significantly.  Exitcare handout on sinusitis and sinus rinse.  Patient verbalized agreement and understanding of treatment plan and had no further questions at this time.   P2:  Hand washing and cover cough  Avoid dairy products increases mucous production.  Phenylephrine 10mg  po q6h prn rhinitis give 4 UD from clinic stock.  Discussed max 4 doses 10mg  per 24 hours or sudafed/pseudoephedrine 30-60mg  po q4-6h prn rhitniis max 240mg  per 24 hours.  Caution these medicines may cause drowsiness/avoid driving and alcohol intake.  Some patients experience insomnia also.  Patient may use normal saline nasal spray 2 sprays each nostril q2h wa as needed. Patient denied personal or family history of ENT cancer.  OTC antihistamine of choice claritin/zyrtec 10mg  po daily.  Avoid triggers if possible.  Shower prior to bedtime if exposed to triggers.  If allergic dust/dust mites recommend mattress/pillow covers/encasements; washing linens, vacuuming, sweeping, dusting weekly.  Call or return to clinic as needed if these symptoms worsen or fail to improve as anticipated.   Exitcare handout on nonallergic rhinitis and allergic rhinitis and sinus rinse.  Patient verbalized understanding of instructions, agreed with plan of care and had no further questions at this time.  P2:  Avoidance and hand washing.  Need to  dry up post nasal drip. Usually no specific medical treatment is needed if a virus is causing the sore throat. The throat most often gets better on its own within 5 to 7 days. Antibiotic medicine does not cure viral pharyngitis.  For acute pharyngitis caused by bacteria, your healthcare provider will prescribe an antibiotic.  Marland Kitchen Do not smoke.  Marland Kitchen Avoid secondhand smoke and other air pollutants.  . Use a cool mist humidifier to add moisture to the air.  . Get plenty of rest.  . You may want to rest your throat by talking less and eating a diet that is mostly liquid or soft for a day or two.  Marland Kitchen Nonprescription throat lozenges and mouthwashes should help relieve the soreness.  . Gargling with warm saltwater and drinking warm liquids may help. (You can make a saltwater solution by adding 1/4 teaspoon of salt to 8 ounces, or 240 mL, of warm water.)  . A nonprescription pain reliever such as aspirin, acetaminophen, or ibuprofen may ease general aches and pains. Exitcare handout on pharyngitis. FOLLOW UP with clinic provider if no improvements in the next 7-10 days. Patient verbalized understanding of  instructions and agreed with plan of care.  P2: Hand washing and diet.

## 2018-10-02 NOTE — Patient Instructions (Addendum)
Sinusitis, Adult Sinusitis is soreness and inflammation of your sinuses. Sinuses are hollow spaces in the bones around your face. Your sinuses are located:  Around your eyes.  In the middle of your forehead.  Behind your nose.  In your cheekbones.  Your sinuses and nasal passages are lined with a stringy fluid (mucus). Mucus normally drains out of your sinuses. When your nasal tissues become inflamed or swollen, the mucus can become trapped or blocked so air cannot flow through your sinuses. This allows bacteria, viruses, and funguses to grow, which leads to infection. Sinusitis can develop quickly and last for 7?10 days (acute) or for more than 12 weeks (chronic). Sinusitis often develops after a cold. What are the causes? This condition is caused by anything that creates swelling in the sinuses or stops mucus from draining, including:  Allergies.  Asthma.  Bacterial or viral infection.  Abnormally shaped bones between the nasal passages.  Nasal growths that contain mucus (nasal polyps).  Narrow sinus openings.  Pollutants, such as chemicals or irritants in the air.  A foreign object stuck in the nose.  A fungal infection. This is rare.  What increases the risk? The following factors may make you more likely to develop this condition:  Having allergies or asthma.  Having had a recent cold or respiratory tract infection.  Having structural deformities or blockages in your nose or sinuses.  Having a weak immune system.  Doing a lot of swimming or diving.  Overusing nasal sprays.  Smoking.  What are the signs or symptoms? The main symptoms of this condition are pain and a feeling of pressure around the affected sinuses. Other symptoms include:  Upper toothache.  Earache.  Headache.  Bad breath.  Decreased sense of smell and taste.  A cough that may get worse at night.  Fatigue.  Fever.  Thick drainage from your nose. The drainage is often green and  it may contain pus (purulent).  Stuffy nose or congestion.  Postnasal drip. This is when extra mucus collects in the throat or back of the nose.  Swelling and warmth over the affected sinuses.  Sore throat.  Sensitivity to light.  How is this diagnosed? This condition is diagnosed based on symptoms, a medical history, and a physical exam. To find out if your condition is acute or chronic, your health care provider may:  Look in your nose for signs of nasal polyps.  Tap over the affected sinus to check for signs of infection.  View the inside of your sinuses using an imaging device that has a light attached (endoscope).  If your health care provider suspects that you have chronic sinusitis, you may also:  Be tested for allergies.  Have a sample of mucus taken from your nose (nasal culture) and checked for bacteria.  Have a mucus sample examined to see if your sinusitis is related to an allergy.  If your sinusitis does not respond to treatment and it lasts longer than 8 weeks, you may have an MRI or CT scan to check your sinuses. These scans also help to determine how severe your infection is. In rare cases, a bone biopsy may be done to rule out more serious types of fungal sinus disease. How is this treated? Treatment for sinusitis depends on the cause and whether your condition is chronic or acute. If a virus is causing your sinusitis, your symptoms will go away on their own within 10 days. You may be given medicines to relieve your symptoms,   including:  Topical nasal decongestants. They shrink swollen nasal passages and let mucus drain from your sinuses.  Antihistamines. These drugs block inflammation that is triggered by allergies. This can help to ease swelling in your nose and sinuses.  Topical nasal corticosteroids. These are nasal sprays that ease inflammation and swelling in your nose and sinuses.  Nasal saline washes. These rinses can help to get rid of thick mucus in  your nose.  If your condition is caused by bacteria, you will be given an antibiotic medicine. If your condition is caused by a fungus, you will be given an antifungal medicine. Surgery may be needed to correct underlying conditions, such as narrow nasal passages. Surgery may also be needed to remove polyps. Follow these instructions at home: Medicines  Take, use, or apply over-the-counter and prescription medicines only as told by your health care provider. These may include nasal sprays.  If you were prescribed an antibiotic medicine, take it as told by your health care provider. Do not stop taking the antibiotic even if you start to feel better. Hydrate and Humidify  Drink enough water to keep your urine clear or pale yellow. Staying hydrated will help to thin your mucus.  Use a cool mist humidifier to keep the humidity level in your home above 50%.  Inhale steam for 10-15 minutes, 3-4 times a day or as told by your health care provider. You can do this in the bathroom while a hot shower is running.  Limit your exposure to cool or dry air. Rest  Rest as much as possible.  Sleep with your head raised (elevated).  Make sure to get enough sleep each night. General instructions  Apply a warm, moist washcloth to your face 3-4 times a day or as told by your health care provider. This will help with discomfort.  Wash your hands often with soap and water to reduce your exposure to viruses and other germs. If soap and water are not available, use hand sanitizer.  Do not smoke. Avoid being around people who are smoking (secondhand smoke).  Keep all follow-up visits as told by your health care provider. This is important. Contact a health care provider if:  You have a fever.  Your symptoms get worse.  Your symptoms do not improve within 10 days. Get help right away if:  You have a severe headache.  You have persistent vomiting.  You have pain or swelling around your face or  eyes.  You have vision problems.  You develop confusion.  Your neck is stiff.  You have trouble breathing. This information is not intended to replace advice given to you by your health care provider. Make sure you discuss any questions you have with your health care provider. Document Released: 11/26/2005 Document Revised: 07/22/2016 Document Reviewed: 09/21/2015 Elsevier Interactive Patient Education  2018 [employer]. Sinus Rinse What is a sinus rinse? A sinus rinse is a simple home treatment that is used to rinse your sinuses with a sterile mixture of salt and water (saline solution). Sinuses are air-filled spaces in your skull behind the bones of your face and forehead that open into your nasal cavity. You will use the following:  Saline solution.  Neti pot or spray bottle. This releases the saline solution into your nose and through your sinuses. Neti pots and spray bottles can be purchased at [employer], a health food store, or online.  When would I do a sinus rinse? A sinus rinse can help to clear  mucus, dirt, dust, or pollen from the nasal cavity. You may do a sinus rinse when you have a cold, a virus, nasal allergy symptoms, a sinus infection, or stuffiness in the nose or sinuses. If you are considering a sinus rinse:  Ask your child's health care provider before performing a sinus rinse on your child.  Do not do a sinus rinse if you have had ear or nasal surgery, ear infection, or blocked ears.  How do I do a sinus rinse?  Wash your hands.  Disinfect your device according to the directions provided and then dry it.  Use the solution that comes with your device or one that is sold separately in stores. Follow the mixing directions on the package.  Fill your device with the amount of saline solution as directed by the device instructions.  Stand over a sink and tilt your head sideways over the sink.  Place the spout of the device in your upper nostril (the  one closer to the ceiling).  Gently pour or squeeze the saline solution into the nasal cavity. The liquid should drain to the lower nostril if you are not overly congested.  Gently blow your nose. Blowing too hard may cause ear pain.  Repeat in the other nostril.  Clean and rinse your device with clean water and then air-dry it. Are there risks of a sinus rinse? Sinus rinse is generally very safe and effective. However, there are a few risks, which include:  A burning sensation in the sinuses. This may happen if you do not make the saline solution as directed. Make sure to follow all directions when making the saline solution.  Infection from contaminated water. This is rare, but possible.  Nasal irritation.  This information is not intended to replace advice given to you by your health care provider. Make sure you discuss any questions you have with your health care provider. Document Released: 06/23/2014 Document Revised: 10/23/2016 Document Reviewed: 04/13/2014 Elsevier Interactive Patient Education  2017 [employer]  Nonallergic Rhinitis Nonallergic rhinitis is a condition that causes symptoms that affect the nose, such as a runny nose and a stuffed-up nose (nasal congestion) that can make it hard to breathe through the nose. This condition is different from having an allergy (allergic rhinitis). Allergic rhinitis occurs when the body's defense system (immune system) reacts to a substance that you are allergic to (allergen), such as pollen, pet dander, mold, or dust. Nonallergic rhinitis has many similar symptoms, but it is not caused by allergens. Nonallergic rhinitis can be a short-term or long-term problem. What are the causes? This condition can be caused by many different things. Some common types of nonallergic rhinitis include: Infectious rhinitis  This is usually due to an infection in the upper respiratory tract. Vasomotor rhinitis  This is the most common type of  long-term nonallergic rhinitis.  It is caused by too much blood flow through the nose, which makes the tissue inside of the nose swell.  Symptoms are often triggered by strong odors, cold air, stress, drinking alcohol, cigarette smoke, or changes in the weather. Occupational rhinitis  This type is caused by triggers in the workplace, such as chemicals, dusts, animal dander, or air pollution. Hormonal rhinitis  This type occurs in women as a result of an increase in the male hormone estrogen.  It may occur during pregnancy, puberty, and menstrual cycles.  Symptoms improve when estrogen levels drop. Drug-induced rhinitis Several drugs can cause nonallergic rhinitis, including:  Medicines that are  used to treat high blood pressure, heart disease, and Parkinson disease.  Aspirin and NSAIDs.  Over-the-counter nasal decongestant sprays. These can cause a type of nonallergic rhinitis (rhinitis medicamentosa) when they are used for more than a few days.  Nonallergic rhinitis with eosinophilia syndrome (NARES)  This type is caused by having too much of a certain type of white blood cell (eosinophil). Nonallergic rhinitis can also be caused by a reaction to eating hot or spicy foods. This does not usually cause long-term symptoms. In some cases, the cause of nonallergic rhinitis is not known. What increases the risk? You are more likely to develop this condition if:  You are 75-44 years of age.  You are a woman. Women are twice as likely to have this condition.  What are the signs or symptoms? Common symptoms of this condition include:  Nasal congestion.  Runny nose.  The feeling of mucus going down the back of the throat (postnasal drip).  Trouble sleeping at night and daytime sleepiness.  Less common symptoms include:  Sneezing.  Coughing.  Itchy nose.  Bloodshot eyes.  How is this diagnosed? This condition may be diagnosed based on:  Your symptoms and medical  history.  A physical exam.  Allergy testing to rule out allergic rhinitis. You may have skin tests or blood tests.  In some cases, the health care provider may take a swab of nasal secretions to look for an increased number of eosinophils. This would be done to confirm a diagnosis of NARES. How is this treated? Treatment for this condition depends on the cause. No single treatment works for everyone. Work with your health care provider to find the best treatment for you. Treatment may include:  Avoiding the things that trigger your symptoms.  Using medicines to relieve congestion, such as: ? Steroid nasal spray. There are many types. You may need to try a few to find out which one works best. ? Decongestant medicine. This may be an oral medicine or a nasal spray. These medicines are only used for a short time.  Using medicines to relieve a runny nose. These may include antihistamine medicines or anticholinergic nasal sprays.  Surgery to remove tissue from inside the nose may be needed in severe cases if the condition has not improved after 6-12 months of medical treatment. Follow these instructions at home:  Take or use over-the-counter and prescription medicines only as told by your health care provider. Do not stop using your medicine even if you start to feel better.  Use salt-water (saline) rinses or other solutions (nasal washes or irrigations) to wash or rinse out the inside of your nose as told by your health care provider.  Do not take NSAIDs or medicines that contain aspirin if they make your symptoms worse.  Do not drink alcohol if it makes your symptoms worse.  Do not use any tobacco products, such as cigarettes, chewing tobacco, and e-cigarettes. If you need help quitting, ask your health care provider.  Avoid secondhand smoke.  Get some exercise every day. Exercise may help reduce symptoms of nonallergic rhinitis for some people. Ask your health care provider how much  exercise and what types of exercise are safe for you.  Sleep with the head of your bed raised (elevated). This may reduce nighttime nasal congestion.  Keep all follow-up visits as told by your health care provider. This is important. Contact a health care provider if:  You have a fever.  Your symptoms are getting worse at  home.  Your symptoms are not responding to medicine.  You develop new symptoms, especially a headache or nosebleed. This information is not intended to replace advice given to you by your health care provider. Make sure you discuss any questions you have with your health care provider. Document Released: 03/19/2016 Document Revised: 05/03/2016 Document Reviewed: 02/16/2016 Elsevier Interactive Patient Education  2018 [employer]. Allergic Rhinitis, Adult Allergic rhinitis is an allergic reaction that affects the mucous membrane inside the nose. It causes sneezing, a runny or stuffy nose, and the feeling of mucus going down the back of the throat (postnasal drip). Allergic rhinitis can be mild to severe. There are two types of allergic rhinitis:  Seasonal. This type is also called hay fever. It happens only during certain seasons.  Perennial. This type can happen at any time of the year.  What are the causes? This condition happens when the body's defense system (immune system) responds to certain harmless substances called allergens as though they were germs.  Seasonal allergic rhinitis is triggered by pollen, which can come from grasses, trees, and weeds. Perennial allergic rhinitis may be caused by:  House dust mites.  Pet dander.  Mold spores.  What are the signs or symptoms? Symptoms of this condition include:  Sneezing.  Runny or stuffy nose (nasal congestion).  Postnasal drip.  Itchy nose.  Tearing of the eyes.  Trouble sleeping.  Daytime sleepiness.  How is this diagnosed? This condition may be diagnosed based on:  Your medical  history.  A physical exam.  Tests to check for related conditions, such as: ? Asthma. ? Pink eye. ? Ear infection. ? Upper respiratory infection.  Tests to find out which allergens trigger your symptoms. These may include skin or blood tests.  How is this treated? There is no cure for this condition, but treatment can help control symptoms. Treatment may include:  Taking medicines that block allergy symptoms, such as antihistamines. Medicine may be given as a shot, nasal spray, or pill.  Avoiding the allergen.  Desensitization. This treatment involves getting ongoing shots until your body becomes less sensitive to the allergen. This treatment may be done if other treatments do not help.  If taking medicine and avoiding the allergen does not work, new, stronger medicines may be prescribed.  Follow these instructions at home:  Find out what you are allergic to. Common allergens include smoke, dust, and pollen.  Avoid the things you are allergic to. These are some things you can do to help avoid allergens: ? Replace carpet with wood, tile, or vinyl flooring. Carpet can trap dander and dust. ? Do not smoke. Do not allow smoking in your home. ? Change your heating and air conditioning filter at least once a month. ? During allergy season:  Keep windows closed as much as possible.  Plan outdoor activities when pollen counts are lowest. This is usually during the evening hours.  When coming indoors, change clothing and shower before sitting on furniture or bedding.  Take over-the-counter and prescription medicines only as told by your health care provider.  Keep all follow-up visits as told by your health care provider. This is important. Contact a health care provider if:  You have a fever.  You develop a persistent cough.  You make whistling sounds when you breathe (you wheeze).  Your symptoms interfere with your normal daily activities. Get help right away if:  You  have shortness of breath. Summary  This condition can be managed by taking  medicines as directed and avoiding allergens.  Contact your health care provider if you develop a persistent cough or fever.  During allergy season, keep windows closed as much as possible. This information is not intended to replace advice given to you by your health care provider. Make sure you discuss any questions you have with your health care provider. Document Released: 08/21/2001 Document Revised: 01/03/2017 Document Reviewed: 01/03/2017 Elsevier Interactive Patient Education  2018 [employer]  Pharyngitis Pharyngitis is redness, pain, and swelling (inflammation) of the throat (pharynx). It is a very common cause of sore throat. Pharyngitis can be caused by a bacteria, but it is usually caused by a virus. Most cases of pharyngitis get better on their own without treatment. What are the causes? This condition may be caused by:  Infection by viruses (viral). Viral pharyngitis spreads from person to person (is contagious) through coughing, sneezing, and sharing of personal items or utensils such as cups, forks, spoons, and toothbrushes.  Infection by bacteria (bacterial). Bacterial pharyngitis may be spread by touching the nose or face after coming in contact with the bacteria, or through more intimate contact, such as kissing.  Allergies. Allergies can cause buildup of mucus in the throat (post-nasal drip), leading to inflammation and irritation. Allergies can also cause blocked nasal passages, forcing breathing through the mouth, which dries and irritates the throat.  What increases the risk? You are more likely to develop this condition if:  You are 63-48 years old.  You are exposed to crowded environments such as daycare, school, or dormitory living.  You live in a cold climate.  You have a weakened disease-fighting (immune) system.  What are the signs or symptoms? Symptoms of this condition vary  by the cause (viral, bacterial, or allergies) and can include:  Sore throat.  Fatigue.  Low-grade fever.  Headache.  Joint pain and muscle aches.  Skin rashes.  Swollen glands in the throat (lymph nodes).  Plaque-like film on the throat or tonsils. This is often a symptom of bacterial pharyngitis.  Vomiting.  Stuffy nose (nasal congestion).  Cough.  Red, itchy eyes (conjunctivitis).  Loss of appetite.  How is this diagnosed? This condition is often diagnosed based on your medical history and a physical exam. Your health care provider will ask you questions about your illness and your symptoms. A swab of your throat may be done to check for bacteria (rapid strep test). Other lab tests may also be done, depending on the suspected cause, but these are rare. How is this treated? This condition usually gets better in 3-4 days without medicine. Bacterial pharyngitis may be treated with antibiotic medicines. Follow these instructions at home:  Take over-the-counter and prescription medicines only as told by your health care provider. ? If you were prescribed an antibiotic medicine, take it as told by your health care provider. Do not stop taking the antibiotic even if you start to feel better. ? Do not give children aspirin because of the association with Reye syndrome.  Drink enough water and fluids to keep your urine clear or pale yellow.  Get a lot of rest.  Gargle with a salt-water mixture 3-4 times a day or as needed. To make a salt-water mixture, completely dissolve -1 tsp of salt in 1 cup of warm water.  If your health care provider approves, you may use throat lozenges or sprays to soothe your throat. Contact a health care provider if:  You have large, tender lumps in your neck.  You have  a rash.  You cough up green, yellow-brown, or bloody spit. Get help right away if:  Your neck becomes stiff.  You drool or are unable to swallow liquids.  You cannot drink  or take medicines without vomiting.  You have severe pain that does not go away, even after you take medicine.  You have trouble breathing, and it is not caused by a stuffy nose.  You have new pain and swelling in your joints such as the knees, ankles, wrists, or elbows. Summary  Pharyngitis is redness, pain, and swelling (inflammation) of the throat (pharynx).  While pharyngitis can be caused by a bacteria, the most common causes are viral.  Most cases of pharyngitis get better on their own without treatment.  Bacterial pharyngitis is treated with antibiotic medicines. This information is not intended to replace advice given to you by your health care provider. Make sure you discuss any questions you have with your health care provider. Document Released: 11/26/2005 Document Revised: 01/01/2017 Document Reviewed: 01/01/2017 Elsevier Interactive Patient Education  [employer].

## 2018-10-09 ENCOUNTER — Ambulatory Visit: Payer: Self-pay | Admitting: Registered Nurse

## 2018-10-09 ENCOUNTER — Encounter: Payer: Self-pay | Admitting: Registered Nurse

## 2018-10-09 VITALS — BP 106/84 | HR 97 | Temp 99.0°F

## 2018-10-09 DIAGNOSIS — J00 Acute nasopharyngitis [common cold]: Secondary | ICD-10-CM

## 2018-10-09 MED ORDER — PSEUDOEPHEDRINE HCL 30 MG PO TABS: 30.0000 mg | ORAL_TABLET | Freq: Four times a day (QID) | ORAL | 0 refills | Status: AC | PRN

## 2018-10-09 NOTE — Progress Notes (Signed)
Subjective:    Patient ID: Gregory Hamilton, male    DOB: [birth year], 52 y.o.   MRN: [id number]  52y/o Caucasian established male pt c/o continued, worsening productive cough. Also with sinus pain/pressure, rhinorrhea, bilateral ear popping. Denies sore throat. Started zpak yesterday feeling like his sx were worsening with more productive cough and chest congestion, fatigue. Wants to make sure this is the correct course of treatment as coworkers have had similar sx recently and zpaks did not work for them. He is only using zpak and saline at home currently. Phenylephrine was stopped after he purchased 12hour pseudoephedrine dose 120mg  and felt like it was keeping him awake.   + sick contacts at work     Review of Systems  Constitutional: Positive for activity change and fatigue. Negative for appetite change, chills, diaphoresis and fever.  HENT: Positive for postnasal drip, rhinorrhea, sinus pressure and sinus pain. Negative for congestion, ear discharge, ear pain, hearing loss, nosebleeds, sneezing, sore throat, tinnitus, trouble swallowing and voice change.   Eyes: Negative for photophobia and visual disturbance.  Respiratory: Positive for cough. Negative for choking, chest tightness, shortness of breath, wheezing and stridor.   Cardiovascular: Negative for palpitations.  Gastrointestinal: Negative for abdominal distention, abdominal pain, diarrhea, nausea and vomiting.  Endocrine: Negative for cold intolerance and heat intolerance.  Genitourinary: Negative for difficulty urinating and dysuria.  Musculoskeletal: Negative for arthralgias, back pain, gait problem, joint swelling, myalgias, neck pain and neck stiffness.  Skin: Negative for color change, pallor, rash and wound.  Allergic/Immunologic: Positive for environmental allergies and immunocompromised state. Negative for food allergies.  Neurological: Negative for dizziness, tremors, seizures, syncope, facial asymmetry, speech difficulty,  weakness, light-headedness, numbness and headaches.  Hematological: Negative for adenopathy. Does not bruise/bleed easily.  Psychiatric/Behavioral: Negative for agitation, confusion and sleep disturbance.       Objective:   Physical Exam  Constitutional: He is oriented to person, place, and time. Vital signs are normal. He appears well-developed and well-nourished. He is active and cooperative.  Non-toxic appearance. He does not have a sickly appearance. He appears ill. No distress.  HENT:  Head: Normocephalic and atraumatic.  Right Ear: Hearing, external ear and ear canal normal. A middle ear effusion is present.  Left Ear: Hearing, external ear and ear canal normal. A middle ear effusion is present.  Nose: Mucosal edema and rhinorrhea present. No nose lacerations, sinus tenderness, nasal deformity, septal deviation or nasal septal hematoma. No epistaxis.  No foreign bodies. Right sinus exhibits no maxillary sinus tenderness and no frontal sinus tenderness. Left sinus exhibits no maxillary sinus tenderness and no frontal sinus tenderness.  Mouth/Throat: Uvula is midline and mucous membranes are normal. Mucous membranes are not pale, not dry and not cyanotic. He does not have dentures. No oral lesions. No trismus in the jaw. Normal dentition. No dental abscesses, uvula swelling, lacerations or dental caries. Posterior oropharyngeal edema and posterior oropharyngeal erythema present. No oropharyngeal exudate or tonsillar abscesses. No tonsillar exudate.  Bilateral allergic shiners; cobblestoning posterior pharynx; clear discharge bilateral nasal turbinates edema/erythema frequent nasal clearing in exam room; bilateral TMs air fluid level clear  Eyes: Pupils are equal, round, and reactive to light. Conjunctivae, EOM and lids are normal. Right eye exhibits no chemosis, no discharge, no exudate and no hordeolum. No foreign body present in the right eye. Left eye exhibits no chemosis, no discharge, no  exudate and no hordeolum. No foreign body present in the left eye. Right conjunctiva is not injected. Right conjunctiva  has no hemorrhage. Left conjunctiva is not injected. Left conjunctiva has no hemorrhage. No scleral icterus. Right eye exhibits normal extraocular motion and no nystagmus. Left eye exhibits normal extraocular motion and no nystagmus. Right pupil is round and reactive. Left pupil is round and reactive. Pupils are equal.  Neck: Trachea normal, normal range of motion and phonation normal. Neck supple. No tracheal tenderness, no spinous process tenderness and no muscular tenderness present. No neck rigidity. No tracheal deviation, no edema, no erythema and normal range of motion present. No thyroid mass and no thyromegaly present.  Cardiovascular: Normal rate, regular rhythm, S1 normal, S2 normal, normal heart sounds and intact distal pulses. PMI is not displaced. Exam reveals no gallop and no friction rub.  No murmur heard. Pulmonary/Chest: Effort normal and breath sounds normal. No stridor. No respiratory distress. He has no decreased breath sounds. He has no wheezes. He has no rhonchi. He has no rales.  No cough observed in exam room; spoke full sentences without difficulty  Abdominal: Soft. Normal appearance. He exhibits no distension, no fluid wave and no ascites. There is no rigidity and no guarding.  Musculoskeletal: Normal range of motion. He exhibits no edema or tenderness.       Right shoulder: Normal.       Left shoulder: Normal.       Right elbow: Normal.      Left elbow: Normal.       Right hip: Normal.       Left hip: Normal.       Right knee: Normal.       Left knee: Normal.       Cervical back: Normal.       Thoracic back: Normal.       Lumbar back: Normal.       Right hand: Normal.       Left hand: Normal.  Lymphadenopathy:       Head (right side): No submental, no submandibular, no tonsillar, no preauricular, no posterior auricular and no occipital adenopathy  present.       Head (left side): No submental, no submandibular, no tonsillar, no preauricular, no posterior auricular and no occipital adenopathy present.    He has no cervical adenopathy.       Right cervical: No superficial cervical, no deep cervical and no posterior cervical adenopathy present.      Left cervical: No superficial cervical, no deep cervical and no posterior cervical adenopathy present.  Neurological: He is alert and oriented to person, place, and time. He has normal strength. He displays no atrophy and no tremor. No cranial nerve deficit or sensory deficit. He exhibits normal muscle tone. He displays no seizure activity. Coordination and gait normal. GCS eye subscore is 4. GCS verbal subscore is 5. GCS motor subscore is 6.  On/off exam table; in/out of chair without difficulty; gait sure and steady in hallway  Skin: Skin is warm and intact. No abrasion, no bruising, no burn, no ecchymosis, no laceration, no lesion, no petechiae and no rash noted. He is not diaphoretic. No cyanosis or erythema. No pallor. Nails show no clubbing.  Facial and neck skin slightly damp  Psychiatric: He has a normal mood and affect. His speech is normal and behavior is normal. Judgment and thought content normal. He is not actively hallucinating. Cognition and memory are normal. He is attentive.  Nursing note and vitals reviewed.         Assessment & Plan:  A-acute rhinitis  P-post nasal  drip causing cough.  May finish azithromycin as ordered for sinusitis.  Continue saline nasal 2 sprays each nostril q2h wa and shower bid.  OTC delsym/robitussin po per manufacturer's instructions prn cough.  Discussed may cut pseudoephedrine 120mg  po BID in half or quarters if stimulant effect causing insomnia and do not take without 4 hours of bedtime or if he notices insomnia when taking sudafed after lunch.  Patient did not want to take flonase due to elevated corticosteroid blood levels with genvoya  administration even short term.  Discussed with patient virus seen in community last 10-14 days rest, hydration, regular meals and trying to control rhinitis to prevent throat irritation/cough/bronchitis and eustachian tube dysfunction.  Patient to follow up if new or worsening symptoms e.g. Fever, wheezing, sob, ear discharge, worsening sinus pain/pressure despite plan of care for re-evaluation.  exitcare handout rhinitis allergic and nonallergic, sinus rinse.  Patient verbalized understanding information/instrucitons, agreed with plan of care and had no further questions at this time.

## 2018-10-09 NOTE — Patient Instructions (Signed)
Nonallergic Rhinitis Nonallergic rhinitis is a condition that causes symptoms that affect the nose, such as a runny nose and a stuffed-up nose (nasal congestion) that can make it hard to breathe through the nose. This condition is different from having an allergy (allergic rhinitis). Allergic rhinitis occurs when the body's defense system (immune system) reacts to a substance that you are allergic to (allergen), such as pollen, pet dander, mold, or dust. Nonallergic rhinitis has many similar symptoms, but it is not caused by allergens. Nonallergic rhinitis can be a short-term or long-term problem. What are the causes? This condition can be caused by many different things. Some common types of nonallergic rhinitis include: Infectious rhinitis  This is usually due to an infection in the upper respiratory tract. Vasomotor rhinitis  This is the most common type of long-term nonallergic rhinitis.  It is caused by too much blood flow through the nose, which makes the tissue inside of the nose swell.  Symptoms are often triggered by strong odors, cold air, stress, drinking alcohol, cigarette smoke, or changes in the weather. Occupational rhinitis  This type is caused by triggers in the workplace, such as chemicals, dusts, animal dander, or air pollution. Hormonal rhinitis  This type occurs in women as a result of an increase in the male hormone estrogen.  It may occur during pregnancy, puberty, and menstrual cycles.  Symptoms improve when estrogen levels drop. Drug-induced rhinitis Several drugs can cause nonallergic rhinitis, including:  Medicines that are used to treat high blood pressure, heart disease, and Parkinson disease.  Aspirin and NSAIDs.  Over-the-counter nasal decongestant sprays. These can cause a type of nonallergic rhinitis (rhinitis medicamentosa) when they are used for more than a few days.  Nonallergic rhinitis with eosinophilia syndrome (NARES)  This type is caused  by having too much of a certain type of white blood cell (eosinophil). Nonallergic rhinitis can also be caused by a reaction to eating hot or spicy foods. This does not usually cause long-term symptoms. In some cases, the cause of nonallergic rhinitis is not known. What increases the risk? You are more likely to develop this condition if:  You are 30-60 years of age.  You are a woman. Women are twice as likely to have this condition.  What are the signs or symptoms? Common symptoms of this condition include:  Nasal congestion.  Runny nose.  The feeling of mucus going down the back of the throat (postnasal drip).  Trouble sleeping at night and daytime sleepiness.  Less common symptoms include:  Sneezing.  Coughing.  Itchy nose.  Bloodshot eyes.  How is this diagnosed? This condition may be diagnosed based on:  Your symptoms and medical history.  A physical exam.  Allergy testing to rule out allergic rhinitis. You may have skin tests or blood tests.  In some cases, the health care provider may take a swab of nasal secretions to look for an increased number of eosinophils. This would be done to confirm a diagnosis of NARES. How is this treated? Treatment for this condition depends on the cause. No single treatment works for everyone. Work with your health care provider to find the best treatment for you. Treatment may include:  Avoiding the things that trigger your symptoms.  Using medicines to relieve congestion, such as: ? Steroid nasal spray. There are many types. You may need to try a few to find out which one works best. ? Decongestant medicine. This may be an oral medicine or a nasal spray. These   medicines are only used for a short time.  Using medicines to relieve a runny nose. These may include antihistamine medicines or anticholinergic nasal sprays.  Surgery to remove tissue from inside the nose may be needed in severe cases if the condition has not improved  after 6-12 months of medical treatment. Follow these instructions at home:  Take or use over-the-counter and prescription medicines only as told by your health care provider. Do not stop using your medicine even if you start to feel better.  Use salt-water (saline) rinses or other solutions (nasal washes or irrigations) to wash or rinse out the inside of your nose as told by your health care provider.  Do not take NSAIDs or medicines that contain aspirin if they make your symptoms worse.  Do not drink alcohol if it makes your symptoms worse.  Do not use any tobacco products, such as cigarettes, chewing tobacco, and e-cigarettes. If you need help quitting, ask your health care provider.  Avoid secondhand smoke.  Get some exercise every day. Exercise may help reduce symptoms of nonallergic rhinitis for some people. Ask your health care provider how much exercise and what types of exercise are safe for you.  Sleep with the head of your bed raised (elevated). This may reduce nighttime nasal congestion.  Keep all follow-up visits as told by your health care provider. This is important. Contact a health care provider if:  You have a fever.  Your symptoms are getting worse at home.  Your symptoms are not responding to medicine.  You develop new symptoms, especially a headache or nosebleed. This information is not intended to replace advice given to you by your health care provider. Make sure you discuss any questions you have with your health care provider. Document Released: 03/19/2016 Document Revised: 05/03/2016 Document Reviewed: 02/16/2016 Elsevier Interactive Patient Education  2018 [employer]. Allergic Rhinitis, Adult Allergic rhinitis is an allergic reaction that affects the mucous membrane inside the nose. It causes sneezing, a runny or stuffy nose, and the feeling of mucus going down the back of the throat (postnasal drip). Allergic rhinitis can be mild to severe. There are  two types of allergic rhinitis:  Seasonal. This type is also called hay fever. It happens only during certain seasons.  Perennial. This type can happen at any time of the year.  What are the causes? This condition happens when the body's defense system (immune system) responds to certain harmless substances called allergens as though they were germs.  Seasonal allergic rhinitis is triggered by pollen, which can come from grasses, trees, and weeds. Perennial allergic rhinitis may be caused by:  House dust mites.  Pet dander.  Mold spores.  What are the signs or symptoms? Symptoms of this condition include:  Sneezing.  Runny or stuffy nose (nasal congestion).  Postnasal drip.  Itchy nose.  Tearing of the eyes.  Trouble sleeping.  Daytime sleepiness.  How is this diagnosed? This condition may be diagnosed based on:  Your medical history.  A physical exam.  Tests to check for related conditions, such as: ? Asthma. ? Pink eye. ? Ear infection. ? Upper respiratory infection.  Tests to find out which allergens trigger your symptoms. These may include skin or blood tests.  How is this treated? There is no cure for this condition, but treatment can help control symptoms. Treatment may include:  Taking medicines that block allergy symptoms, such as antihistamines. Medicine may be given as a shot, nasal spray, or pill.  Avoiding   the allergen.  Desensitization. This treatment involves getting ongoing shots until your body becomes less sensitive to the allergen. This treatment may be done if other treatments do not help.  If taking medicine and avoiding the allergen does not work, new, stronger medicines may be prescribed.  Follow these instructions at home:  Find out what you are allergic to. Common allergens include smoke, dust, and pollen.  Avoid the things you are allergic to. These are some things you can do to help avoid allergens: ? Replace carpet with wood,  tile, or vinyl flooring. Carpet can trap dander and dust. ? Do not smoke. Do not allow smoking in your home. ? Change your heating and air conditioning filter at least once a month. ? During allergy season:  Keep windows closed as much as possible.  Plan outdoor activities when pollen counts are lowest. This is usually during the evening hours.  When coming indoors, change clothing and shower before sitting on furniture or bedding.  Take over-the-counter and prescription medicines only as told by your health care provider.  Keep all follow-up visits as told by your health care provider. This is important. Contact a health care provider if:  You have a fever.  You develop a persistent cough.  You make whistling sounds when you breathe (you wheeze).  Your symptoms interfere with your normal daily activities. Get help right away if:  You have shortness of breath. Summary  This condition can be managed by taking medicines as directed and avoiding allergens.  Contact your health care provider if you develop a persistent cough or fever.  During allergy season, keep windows closed as much as possible. This information is not intended to replace advice given to you by your health care provider. Make sure you discuss any questions you have with your health care provider. Document Released: 08/21/2001 Document Revised: 01/03/2017 Document Reviewed: 01/03/2017 Elsevier Interactive Patient Education  2018 [employer]. Sinus Rinse What is a sinus rinse? A sinus rinse is a simple home treatment that is used to rinse your sinuses with a sterile mixture of salt and water (saline solution). Sinuses are air-filled spaces in your skull behind the bones of your face and forehead that open into your nasal cavity. You will use the following:  Saline solution.  Neti pot or spray bottle. This releases the saline solution into your nose and through your sinuses. Neti pots and spray bottles can be  purchased at your local pharmacy, a health food store, or online.  When would I do a sinus rinse? A sinus rinse can help to clear mucus, dirt, dust, or pollen from the nasal cavity. You may do a sinus rinse when you have a cold, a virus, nasal allergy symptoms, a sinus infection, or stuffiness in the nose or sinuses. If you are considering a sinus rinse:  Ask your child's health care provider before performing a sinus rinse on your child.  Do not do a sinus rinse if you have had ear or nasal surgery, ear infection, or blocked ears.  How do I do a sinus rinse?  Wash your hands.  Disinfect your device according to the directions provided and then dry it.  Use the solution that comes with your device or one that is sold separately in stores. Follow the mixing directions on the package.  Fill your device with the amount of saline solution as directed by the device instructions.  Stand over a sink and tilt your head sideways over the sink.    Place the spout of the device in your upper nostril (the one closer to the ceiling).  Gently pour or squeeze the saline solution into the nasal cavity. The liquid should drain to the lower nostril if you are not overly congested.  Gently blow your nose. Blowing too hard may cause ear pain.  Repeat in the other nostril.  Clean and rinse your device with clean water and then air-dry it. Are there risks of a sinus rinse? Sinus rinse is generally very safe and effective. However, there are a few risks, which include:  A burning sensation in the sinuses. This may happen if you do not make the saline solution as directed. Make sure to follow all directions when making the saline solution.  Infection from contaminated water. This is rare, but possible.  Nasal irritation.  This information is not intended to replace advice given to you by your health care provider. Make sure you discuss any questions you have with your health care provider. Document  Released: 06/23/2014 Document Revised: 10/23/2016 Document Reviewed: 04/13/2014 Elsevier Interactive Patient Education  2017 [employer] Inc.  

## 2019-03-31 ENCOUNTER — Other Ambulatory Visit: Payer: Self-pay | Admitting: Family Medicine

## 2019-03-31 DIAGNOSIS — T148XXA Other injury of unspecified body region, initial encounter: Secondary | ICD-10-CM

## 2019-03-31 NOTE — Progress Notes (Signed)
He sustained a puncture wound to his hand 2 days ago and needs a tetanus shot.

## 2019-04-01 ENCOUNTER — Other Ambulatory Visit: Payer: Self-pay

## 2019-04-01 ENCOUNTER — Other Ambulatory Visit (INDEPENDENT_AMBULATORY_CARE_PROVIDER_SITE_OTHER): Payer: PRIVATE HEALTH INSURANCE

## 2019-04-01 VITALS — BP 120/70 | HR 78 | Temp 98.2°F | Resp 16 | Wt 174.8 lb

## 2019-04-01 DIAGNOSIS — T148XXA Other injury of unspecified body region, initial encounter: Secondary | ICD-10-CM | POA: Diagnosis not present

## 2019-04-01 DIAGNOSIS — Z23 Encounter for immunization: Secondary | ICD-10-CM | POA: Diagnosis not present

## 2019-04-09 ENCOUNTER — Other Ambulatory Visit: Payer: Self-pay | Admitting: Family Medicine

## 2019-04-09 DIAGNOSIS — Z1211 Encounter for screening for malignant neoplasm of colon: Secondary | ICD-10-CM

## 2019-04-09 NOTE — Progress Notes (Signed)
cologuard

## 2019-12-24 ENCOUNTER — Other Ambulatory Visit: Payer: Self-pay | Admitting: Family Medicine

## 2019-12-24 DIAGNOSIS — Z1211 Encounter for screening for malignant neoplasm of colon: Secondary | ICD-10-CM

## 2020-01-15 LAB — HM COLONOSCOPY

## 2020-01-15 LAB — COLOGUARD: Cologuard: NEGATIVE

## 2020-01-18 LAB — COLOGUARD: COLOGUARD: NEGATIVE

## 2020-01-21 NOTE — Progress Notes (Signed)
Dr.Lalonde spoke with pt . Gregory Hamilton

## 2020-03-08 ENCOUNTER — Ambulatory Visit: Payer: PRIVATE HEALTH INSURANCE | Admitting: Family Medicine

## 2020-03-08 ENCOUNTER — Other Ambulatory Visit: Payer: Self-pay

## 2020-03-08 ENCOUNTER — Encounter: Payer: Self-pay | Admitting: Family Medicine

## 2020-03-08 VITALS — BP 126/84 | HR 79 | Temp 96.8°F | Ht 72.0 in | Wt 184.2 lb

## 2020-03-08 DIAGNOSIS — S43004A Unspecified dislocation of right shoulder joint, initial encounter: Secondary | ICD-10-CM

## 2020-03-08 DIAGNOSIS — Z79899 Other long term (current) drug therapy: Secondary | ICD-10-CM | POA: Diagnosis not present

## 2020-03-08 DIAGNOSIS — B20 Human immunodeficiency virus [HIV] disease: Secondary | ICD-10-CM | POA: Diagnosis not present

## 2020-03-08 NOTE — Progress Notes (Signed)
   Subjective:    Patient ID: Gregory Hamilton, male    DOB: 1965-12-16, 54 y.o.   MRN: [id number]  HPI He is here for a transition of care visit.  He is HIV positive and has been stable on his medication for several years.  He was last seen at [facility] in 2019.  His blood work has always been good.  He continues to do quite nicely on [place].  He does smoke roughly 1 pack/day.  His alcohol consumption is less than 1 pack/day.  He is in a stable monogamous relationship.  He does have a previous history of right shoulder dislocation and subsequent surgery however since then he is still had a dislocation once or twice.  He has no other concerns or complaints.   Review of Systems     Objective:   Physical Exam Alert and in no distress.  Funduscopic exam normal.  Tympanic membranes and canals are normal. Pharyngeal area is normal. Neck is supple without adenopathy or thyromegaly. Cardiac exam shows a regular sinus rhythm without murmurs or gallops. Lungs are clear to auscultation.  No axillary or inguinal adenopathy.  Abdominal exam shows no hepatosplenomegaly, masses or tenderness.  Right shoulder exam shows slight tenderness with Neer's and Hawkins test but no laxity.  Negative drop arm test. The medical records from [facility] was reviewed through care everywhere.  He is up-to-date on his immunizations.       Assessment & Plan:  Human immunodeficiency virus (HIV) disease (HCC) - Plan: CBC with Differential/Platelet, Comprehensive metabolic panel, Lipid panel, T-helper cells (CD4) count (not at [facility]), HIV-1 RNA quant-no reflex-bld  Shoulder dislocation, right, initial encounter  Encounter for long-term (current) use of high-risk medication - Plan: CBC with Differential/Platelet, Comprehensive metabolic panel, Lipid panel, T-helper cells (CD4) count (not at [facility]), HIV-1 RNA quant-no reflex-bld He will continue on his present medication regimen.  Counseled him on smoking cessation.   Also discussed appropriate shoulder rehab for him.  He does have the information at home and encouraged him to start doing this again.

## 2020-03-09 ENCOUNTER — Encounter: Payer: Self-pay | Admitting: Family Medicine

## 2020-03-09 LAB — HIV-1 RNA QUANT-NO REFLEX-BLD: HIV-1 RNA Viral Load: <20 copies/mL

## 2020-03-09 LAB — T-HELPER CELLS (CD4) COUNT (NOT AT ARMC)
% CD 4 Pos. Lymph.: 45.2 % (ref 30.8–58.5)
Absolute CD 4 Helper: 1356 /uL (ref 359–1519)
Basophils Absolute: 0.1 10*3/uL (ref 0.0–0.2)
Basos: 1 %
EOS (ABSOLUTE): 0.1 10*3/uL (ref 0.0–0.4)
Eos: 1 %
Hematocrit: 45.3 % (ref 37.5–51.0)
Hemoglobin: 15.3 g/dL (ref 13.0–17.7)
Immature Grans (Abs): 0.1 10*3/uL (ref 0.0–0.1)
Immature Granulocytes: 1 %
Lymphocytes Absolute: 3 10*3/uL (ref 0.7–3.1)
Lymphs: 34 %
MCH: 32.3 pg (ref 26.6–33.0)
MCHC: 33.8 g/dL (ref 31.5–35.7)
MCV: 96 fL (ref 79–97)
Monocytes Absolute: 0.6 10*3/uL (ref 0.1–0.9)
Monocytes: 7 %
Neutrophils Absolute: 5 10*3/uL (ref 1.4–7.0)
Neutrophils: 56 %
Platelets: 273 10*3/uL (ref 150–450)
RBC: 4.74 x10E6/uL (ref 4.14–5.80)
RDW: 12.2 % (ref 11.6–15.4)
WBC: 8.8 10*3/uL (ref 3.4–10.8)

## 2020-03-09 LAB — COMPREHENSIVE METABOLIC PANEL
ALT: 31 IU/L (ref 0–44)
AST: 24 IU/L (ref 0–40)
Albumin/Globulin Ratio: 1.9 (ref 1.2–2.2)
Albumin: 4.8 g/dL (ref 3.8–4.9)
Alkaline Phosphatase: 96 IU/L (ref 39–117)
BUN/Creatinine Ratio: 9 (ref 9–20)
BUN: 11 mg/dL (ref 6–24)
Bilirubin Total: 0.4 mg/dL (ref 0.0–1.2)
CO2: 23 mmol/L (ref 20–29)
Calcium: 9.4 mg/dL (ref 8.7–10.2)
Chloride: 100 mmol/L (ref 96–106)
Creatinine, Ser: 1.19 mg/dL (ref 0.76–1.27)
GFR calc Af Amer: 81 mL/min/{1.73_m2} (ref >59)
GFR calc non Af Amer: 70 mL/min/{1.73_m2} (ref >59)
Globulin, Total: 2.5 g/dL (ref 1.5–4.5)
Glucose: 80 mg/dL (ref 65–99)
Potassium: 4.2 mmol/L (ref 3.5–5.2)
Sodium: 137 mmol/L (ref 134–144)
Total Protein: 7.3 g/dL (ref 6.0–8.5)

## 2020-03-09 LAB — LIPID PANEL
Chol/HDL Ratio: 4.3 ratio (ref 0.0–5.0)
Cholesterol, Total: 227 mg/dL — ABNORMAL HIGH (ref 100–199)
HDL: 53 mg/dL (ref >39)
LDL Chol Calc (NIH): 145 mg/dL — ABNORMAL HIGH (ref 0–99)
Triglycerides: 160 mg/dL — ABNORMAL HIGH (ref 0–149)
VLDL Cholesterol Cal: 29 mg/dL (ref 5–40)

## 2020-04-01 ENCOUNTER — Telehealth: Payer: Self-pay

## 2020-04-01 NOTE — Telephone Encounter (Signed)
I received a call form [facility] stating that the pt. Needs a refill on his Genvoya, pt. Last apt was 03/08/20.

## 2020-04-03 MED ORDER — ELVITEG-COBIC-EMTRICIT-TENOFAF 150-150-200-10 MG PO TABS: 1.0000 | ORAL_TABLET | Freq: Every day | ORAL | 3 refills | Status: DC

## 2020-04-21 ENCOUNTER — Telehealth: Payer: Self-pay

## 2020-04-21 NOTE — Telephone Encounter (Signed)
I submitted a PA for the pts. Genvoya and it was approved 04/20/20-04/19/21.

## 2020-09-08 ENCOUNTER — Encounter: Payer: Self-pay | Admitting: Family Medicine

## 2020-09-08 ENCOUNTER — Other Ambulatory Visit: Payer: Self-pay

## 2020-09-08 ENCOUNTER — Ambulatory Visit (INDEPENDENT_AMBULATORY_CARE_PROVIDER_SITE_OTHER): Payer: PRIVATE HEALTH INSURANCE | Admitting: Family Medicine

## 2020-09-08 VITALS — BP 104/70 | HR 98 | Temp 97.6°F | Ht 73.0 in | Wt 171.0 lb

## 2020-09-08 DIAGNOSIS — Z87891 Personal history of nicotine dependence: Secondary | ICD-10-CM | POA: Insufficient documentation

## 2020-09-08 DIAGNOSIS — Z79899 Other long term (current) drug therapy: Secondary | ICD-10-CM | POA: Diagnosis not present

## 2020-09-08 DIAGNOSIS — B20 Human immunodeficiency virus [HIV] disease: Secondary | ICD-10-CM | POA: Diagnosis not present

## 2020-09-08 DIAGNOSIS — F17200 Nicotine dependence, unspecified, uncomplicated: Secondary | ICD-10-CM | POA: Diagnosis not present

## 2020-09-08 NOTE — Progress Notes (Signed)
   Subjective:    Patient ID: Gregory Hamilton, male    DOB: Nov 16, 1966, 54 y.o.   MRN: [id number]  HPI He is here for an interval evaluation.  He has lost weight from his last visit but did this on purpose by changing his dietary habits.  He continues to smoke and at this point is not interested in quitting.  He continues on Genvoya and is having no difficulty with that.  He recently had a flu shot.  He has had the majority of vaccine.  Cologuard was done last year.   Review of Systems     Objective:   Physical Exam Alert and in no distress. Tympanic membranes and canals are normal. Pharyngeal area is normal. Neck is supple without adenopathy or thyromegaly. Cardiac exam shows a regular sinus rhythm without murmurs or gallops. Lungs are clear to auscultation. Abdominal exam shows no masses or tenderness. Review of his record indicates that he is always had undetectable viral load and good CD4 count.      Assessment & Plan:  Human immunodeficiency virus (HIV) disease (HCC)  Encounter for long-term (current) use of high-risk medication  Current smoker I discussed yearly evaluation rather than every 6 months and he is comfortable with that especially since he is always had normal numbers. Then discussed smoking cessation with him.  Apparently he has used patches in the past and is considering doing it again.  Recheck here in roughly 6 months or sooner if needed.

## 2020-10-27 ENCOUNTER — Ambulatory Visit (INDEPENDENT_AMBULATORY_CARE_PROVIDER_SITE_OTHER): Payer: PRIVATE HEALTH INSURANCE

## 2020-10-27 ENCOUNTER — Other Ambulatory Visit: Payer: Self-pay

## 2020-10-27 DIAGNOSIS — Z23 Encounter for immunization: Secondary | ICD-10-CM | POA: Diagnosis not present

## 2021-01-27 ENCOUNTER — Encounter: Payer: Self-pay | Admitting: Family Medicine

## 2021-01-27 ENCOUNTER — Telehealth: Payer: PRIVATE HEALTH INSURANCE | Admitting: Family Medicine

## 2021-01-27 ENCOUNTER — Other Ambulatory Visit: Payer: Self-pay

## 2021-01-27 VITALS — Temp 98.6°F | Wt 161.0 lb

## 2021-01-27 DIAGNOSIS — H1033 Unspecified acute conjunctivitis, bilateral: Secondary | ICD-10-CM | POA: Diagnosis not present

## 2021-01-27 DIAGNOSIS — H1030 Unspecified acute conjunctivitis, unspecified eye: Secondary | ICD-10-CM | POA: Diagnosis not present

## 2021-01-27 MED ORDER — ERYTHROMYCIN 5 MG/GM OP OINT: 1.0000 "application " | TOPICAL_OINTMENT | Freq: Three times a day (TID) | OPHTHALMIC | 0 refills | Status: DC

## 2021-01-27 NOTE — Progress Notes (Signed)
   Subjective:    Patient ID: Gregory Hamilton, male    DOB: 12-May-1966, 55 y.o.   MRN: [id number]  HPI Documentation for virtual audio and video telecommunications through Caregility encounter: The patient was located at home. 2 patient identifiers used.  The provider was located in the office. The patient did consent to this visit and is aware of possible charges through their insurance for this visit. The other persons participating in this telemedicine service were none. Time spent on call was 5 minutes and in review of previous records >20 minutes total for counseling and coordination of care. This virtual service is not related to other E/M service within previous 7 days. He states that yesterday he noted watery eyes and a gritty feeling in his eyes and today woke up and they were more pinkish and did have some crusting in the corners.  No fever, chills, sore throat, sneezing or nasal congestion.  Review of Systems     Objective:   Physical Exam Visual inspection was difficult with the camera but did appear to be slightly erythematous conjunctive a.       Assessment & Plan:  Acute conjunctivitis, unspecified acute conjunctivitis type, unspecified laterality - Plan: erythromycin ophthalmic ointment I will treat him for conjunctivitis.  I instructed him on proper use of the ointment.  He will call if further difficulty.

## 2021-02-13 ENCOUNTER — Other Ambulatory Visit: Payer: Self-pay | Admitting: Family Medicine

## 2021-02-14 NOTE — Telephone Encounter (Signed)
[facility] is requesting to fill pt genvoya. Please advise [facility]

## 2021-02-14 NOTE — Progress Notes (Signed)
Chief Complaint  Patient presents with  . Exposure to STD    Periodic partner over the last couple of months (last interaction Feb 19th) tested positive for syphillis. Called him Monday to let him know. Had oral and anal intercourse with him. (I did get a urine, not sure if you need) He did say he would like to have full STD screen. Also while using Q-tip on left ear he noticed some blood-can you check.   Patient presents requesting STD check. He was contacted Monday about + syphillis test in a sexual partner.  Partner is currently in [place].  They have had sexual relations periodically, last was 2/19.  Usually uses condoms, but not always with this partner.  He has HIV, is compliant with medications, and is scheduled soon for his yearly check with Dr. Susann Givens.  He has h/o cold sores, denies genital lesions.  Denies any dysuria, penile discharge. He denies any rashes. No fever, chills, swollen glands.  PMH, PSH, SH reviewed  Outpatient Encounter Medications as of 02/15/2021  Medication Sig  . cetirizine (ZYRTEC) 10 MG tablet Take 10 mg by mouth daily.  Marland Kitchen elvitegravir-cobicistat-emtricitabine-tenofovir (GENVOYA) 150-150-200-10 MG TABS tablet Take 1 tablet by mouth daily.  . [DISCONTINUED] azithromycin (ZITHROMAX) 250 MG tablet Take 2 tablets by mouth now then 1 tablet by mouth daily days 2 through 5 (Patient not taking: No sig reported)  . [DISCONTINUED] erythromycin ophthalmic ointment Place 1 application into both eyes 3 (three) times daily.  . [DISCONTINUED] sodium chloride (OCEAN) 0.65 % SOLN nasal spray Place 2 sprays into both nostrils every 2 (two) hours while awake. (Patient not taking: Reported on 09/08/2020)   No facility-administered encounter medications on file as of 02/15/2021.   Allergies  Allergen Reactions  . Cephalosporins Hives  . Codeine Nausea And Vomiting  . Penicillins Hives   PCN--recalls age 80-10 being told he was allergic (got a rash on his abdomen) He has taken  PCN since then and he didn't have a reaction. (in college)  ROS: No fever, chills, URI symptoms, cough, chest pain. no rashes, bruising. He noted some blood on Qtip from L ear this morning No urinary symptoms No swollen glands  PHYSICAL EXAM: BP 110/80   Pulse 80   Ht 6' (1.829 m)   Wt 161 lb 3.2 oz (73.1 kg)   BMI 21.86 kg/m   Pleasant, thin, slightly anxious male in no distress HEENT: conjunctiva and sclera are clear, EOMI. TM's are normal bilaterally.  There is a small abrasion (area of irritation and recent bleed) along the inferior portion of the distal L ear canal (he reports dislodging a scab with the q-tip, consistent with what I see--no active bleeding). Neck: no lymphadenopathy or mass Heart: regular rate and rhythm, no murmur Lungs: clear bilaterally Back: no CVA tenderness Abdomen: soft, nontender, no mass GU: no inguinal lymphadenopathy. Penis is normal, no lesions or discharge.  ASSESSMENT/PLAN:   Exposure to STD - Encouraged regular condom use. If +syphillis, likely can use PCN, but should confirm with allergist. - Plan: Hepatitis C antibody, RPR w/reflex to TrepSure, GC/Chlamydia Probe Amp  Human immunodeficiency virus (HIV) disease (HCC) - compliant with meds. Scheduled for f/u with Dr. Susann Givens later this month   RPR, HepC GC/chlamydia   Avoid q-tips.  Should heal without any treatment.  Doubt allergy--probably not, but would confirm with allergist prior to treating any syphilis with PCN.

## 2021-02-15 ENCOUNTER — Ambulatory Visit: Payer: PRIVATE HEALTH INSURANCE | Admitting: Family Medicine

## 2021-02-15 ENCOUNTER — Other Ambulatory Visit: Payer: Self-pay

## 2021-02-15 ENCOUNTER — Encounter: Payer: Self-pay | Admitting: Family Medicine

## 2021-02-15 VITALS — BP 110/80 | HR 80 | Ht 72.0 in | Wt 161.2 lb

## 2021-02-15 DIAGNOSIS — Z202 Contact with and (suspected) exposure to infections with a predominantly sexual mode of transmission: Secondary | ICD-10-CM

## 2021-02-15 DIAGNOSIS — B20 Human immunodeficiency virus [HIV] disease: Secondary | ICD-10-CM | POA: Diagnosis not present

## 2021-02-15 NOTE — Patient Instructions (Addendum)
Please avoid using Qtips in your ears.  Please use condoms regularly for prevention of STDs.

## 2021-02-16 LAB — GC/CHLAMYDIA PROBE AMP
Chlamydia trachomatis, NAA: NEGATIVE
Neisseria Gonorrhoeae by PCR: NEGATIVE

## 2021-02-16 NOTE — Telephone Encounter (Signed)
Last viral load labs 1 year ago.  Due for yearly f/u/physical.  Send 30 day supply

## 2021-02-17 LAB — HEPATITIS C ANTIBODY: Hep C Virus Ab: <0.1 s/co ratio (ref 0.0–0.9)

## 2021-02-17 LAB — RPR W/REFLEX TO TREPSURE: RPR: NONREACTIVE

## 2021-02-17 LAB — T PALLIDUM ANTIBODY, EIA: T pallidum Antibody, EIA: NEGATIVE

## 2021-03-08 ENCOUNTER — Encounter: Payer: PRIVATE HEALTH INSURANCE | Admitting: Family Medicine

## 2021-03-09 ENCOUNTER — Encounter: Payer: Self-pay | Admitting: Family Medicine

## 2021-03-09 ENCOUNTER — Ambulatory Visit: Payer: PRIVATE HEALTH INSURANCE | Admitting: Family Medicine

## 2021-03-09 ENCOUNTER — Other Ambulatory Visit: Payer: Self-pay

## 2021-03-09 VITALS — BP 112/80 | HR 77 | Temp 98.9°F | Ht 72.0 in | Wt 161.6 lb

## 2021-03-09 DIAGNOSIS — Z79899 Other long term (current) drug therapy: Secondary | ICD-10-CM | POA: Diagnosis not present

## 2021-03-09 DIAGNOSIS — L739 Follicular disorder, unspecified: Secondary | ICD-10-CM

## 2021-03-09 DIAGNOSIS — Z0000 Encounter for general adult medical examination without abnormal findings: Secondary | ICD-10-CM

## 2021-03-09 DIAGNOSIS — B20 Human immunodeficiency virus [HIV] disease: Secondary | ICD-10-CM | POA: Diagnosis not present

## 2021-03-09 DIAGNOSIS — Z87891 Personal history of nicotine dependence: Secondary | ICD-10-CM

## 2021-03-09 DIAGNOSIS — L723 Sebaceous cyst: Secondary | ICD-10-CM | POA: Insufficient documentation

## 2021-03-09 DIAGNOSIS — Z125 Encounter for screening for malignant neoplasm of prostate: Secondary | ICD-10-CM

## 2021-03-09 MED ORDER — GENVOYA 150-150-200-10 MG PO TABS: 1.0000 | ORAL_TABLET | Freq: Every day | ORAL | 3 refills | Status: DC

## 2021-03-09 NOTE — Progress Notes (Signed)
   Subjective:    Patient ID: Gregory Hamilton, male    DOB: [birth year], 55 y.o.   MRN: [id number]  HPI He is here for complete examination.  He does have underlying allergies and is presently using Zyrtec with good response.  He quit smoking on January 15.  He did use patches for a while. He has a lesion on the right posterior neck that he would like evaluated.  He does occasionally express some whitish material from it.  He has had some difficulty with sleep lately is now using melatonin.  He also has concerns about hair follicle infections.  Review of Systems  All other systems reviewed and are negative.      Objective:   Physical Exam Alert and in no distress. Tympanic membranes and canals are normal. Pharyngeal area is normal. Neck is supple without adenopathy or thyromegaly. Cardiac exam shows a regular sinus rhythm without murmurs or gallops. Lungs are clear to auscultation.  Skin exam does show a 1 and half centimeter round smooth movable lesion in the posterior right neck area.  Exam of his thighs does show some minimal follicular lesions.       Assessment & Plan:  Routine general medical examination at a health care facility - Plan: CBC with Differential/Platelet, Comprehensive metabolic panel, Lipid panel  Human immunodeficiency virus (HIV) disease (HCC) - Plan: elvitegravir-cobicistat-emtricitabine-tenofovir (GENVOYA) 150-150-200-10 MG TABS tablet, HIV-1 RNA quant-no reflex-bld, T-helper cells (CD4) count (not at [facility])  Encounter for long-term (current) use of high-risk medication  Former smoker  Screening for prostate cancer - Plan: PSA  Sebaceous cyst  Folliculitis  I congratulated him on quitting smoking.  We will continue his present medication regimen.  Discussed what will happen next time he gets stressed and starts to fall back on all behavior pattern. No therapy recommended for his sebaceous cyst. Recommend Dial soap or Lever 2000 to help with the skin  lesions.

## 2021-03-10 LAB — LIPID PANEL
Chol/HDL Ratio: 2.7 ratio (ref 0.0–5.0)
Cholesterol, Total: 191 mg/dL (ref 100–199)
HDL: 72 mg/dL (ref >39)
LDL Chol Calc (NIH): 109 mg/dL — ABNORMAL HIGH (ref 0–99)
Triglycerides: 53 mg/dL (ref 0–149)
VLDL Cholesterol Cal: 10 mg/dL (ref 5–40)

## 2021-03-10 LAB — T-HELPER CELLS (CD4) COUNT (NOT AT ARMC)
% CD 4 Pos. Lymph.: 43 % (ref 30.8–58.5)
Absolute CD 4 Helper: 774 /uL (ref 359–1519)
Basophils Absolute: 0.1 10*3/uL (ref 0.0–0.2)
Basos: 1 %
EOS (ABSOLUTE): 0.2 10*3/uL (ref 0.0–0.4)
Eos: 2 %
Hematocrit: 44.2 % (ref 37.5–51.0)
Hemoglobin: 15 g/dL (ref 13.0–17.7)
Immature Grans (Abs): 0 10*3/uL (ref 0.0–0.1)
Immature Granulocytes: 0 %
Lymphocytes Absolute: 1.8 10*3/uL (ref 0.7–3.1)
Lymphs: 21 %
MCH: 32.7 pg (ref 26.6–33.0)
MCHC: 33.9 g/dL (ref 31.5–35.7)
MCV: 96 fL (ref 79–97)
Monocytes Absolute: 0.5 10*3/uL (ref 0.1–0.9)
Monocytes: 6 %
Neutrophils Absolute: 5.8 10*3/uL (ref 1.4–7.0)
Neutrophils: 70 %
Platelets: 273 10*3/uL (ref 150–450)
RBC: 4.59 x10E6/uL (ref 4.14–5.80)
RDW: 12.3 % (ref 11.6–15.4)
WBC: 8.5 10*3/uL (ref 3.4–10.8)

## 2021-03-10 LAB — COMPREHENSIVE METABOLIC PANEL
ALT: 24 IU/L (ref 0–44)
AST: 23 IU/L (ref 0–40)
Albumin/Globulin Ratio: 2.3 — ABNORMAL HIGH (ref 1.2–2.2)
Albumin: 4.8 g/dL (ref 3.8–4.9)
Alkaline Phosphatase: 59 IU/L (ref 44–121)
BUN/Creatinine Ratio: 16 (ref 9–20)
BUN: 17 mg/dL (ref 6–24)
Bilirubin Total: 0.3 mg/dL (ref 0.0–1.2)
CO2: 22 mmol/L (ref 20–29)
Calcium: 9.4 mg/dL (ref 8.7–10.2)
Chloride: 100 mmol/L (ref 96–106)
Creatinine, Ser: 1.04 mg/dL (ref 0.76–1.27)
Globulin, Total: 2.1 g/dL (ref 1.5–4.5)
Glucose: 99 mg/dL (ref 65–99)
Potassium: 4.2 mmol/L (ref 3.5–5.2)
Sodium: 139 mmol/L (ref 134–144)
Total Protein: 6.9 g/dL (ref 6.0–8.5)
eGFR: 86 mL/min/{1.73_m2} (ref >59)

## 2021-03-10 LAB — PSA: Prostate Specific Ag, Serum: 0.4 ng/mL (ref 0.0–4.0)

## 2021-03-10 LAB — HIV-1 RNA QUANT-NO REFLEX-BLD: HIV-1 RNA Viral Load: <20 copies/mL

## 2021-03-13 ENCOUNTER — Other Ambulatory Visit: Payer: Self-pay | Admitting: Family Medicine

## 2021-03-13 DIAGNOSIS — G479 Sleep disorder, unspecified: Secondary | ICD-10-CM

## 2021-03-13 MED ORDER — TRAZODONE HCL 50 MG PO TABS: 25.0000 mg | ORAL_TABLET | Freq: Every evening | ORAL | 1 refills | Status: DC | PRN

## 2021-03-13 NOTE — Progress Notes (Signed)
He called stating that over the last several weeks he has noted difficulty with sleep with no particular reasoning behind this.  I will give him some trazodone to be used to break the cycle.

## 2021-05-15 ENCOUNTER — Telehealth: Payer: Self-pay | Admitting: Registered Nurse

## 2021-05-15 ENCOUNTER — Encounter: Payer: Self-pay | Admitting: Registered Nurse

## 2021-05-15 DIAGNOSIS — U071 COVID-19: Secondary | ICD-10-CM

## 2021-05-15 NOTE — Telephone Encounter (Signed)
[employer] Tim notified NP that pt reported symptoms 05/12/21 Day 0 runny nose and positive home covid test; Patient contacted via telephone reported symptoms started late Friday after going to community event thought it was allergies.  Saturday symptoms progressively worse and unable to get covid test/pharmacies closed by the time he figured out these were not allergy symptoms.  Bought and took test Sunday 05/14/21 came back positive and he contacted [facility] Dr Susann Givens and started OTC medication for rhinitis/sinus congestion (pseudoephedrine, nasal saline, tylenol).  T99.8 yesterday; today temperature back to normal but noticed this afternoon having fatigue, chills and congestion nasal still.  Going to take temperature again this evening.  Lives alone and quarantining.  Notified others he had close contact with 2 days prior to symptoms/positive test that they could have been exposed/to test.  Discussed with patient Day 5 6/8  May return to work 6/9 if symptoms improving and no fever/vomiting/diarrhea 24 hours prior to return to work.  Patient has been working remote and no onsite obligations this week plans to work remote through his quarantine Day 10 6/13.  Discussed mask wear if around anyone outside of his home.  Patient alert and oriented x3, spoke full sentences without difficulty.  Some nasal congestion noted.  No throat clearing or cough during 9 minute telephone call.  Discussed with patient he can contact me at this time number [phone] if questions and unable to reach [facility]   Pt verbalized understanding and agreement with plan of care. No further questions/concerns at this time. Pt reminded to contact clinic with any changes in symptoms or questions/concerns. HR notified patient plans to continue working remote/quarantine through Day 10

## 2021-05-18 NOTE — Telephone Encounter (Signed)
Called pt for sx check. Nasal congestion still present. This and fatigue are his only current sx. Pseudoephedrine at home but has not used since yesterday. Has been using Afrin since Sunday. Last use this morning. Advised to switch to Flonase/Nasacort today to avoid rebound congestion. Continue nasal saline use.  Pt is working remote full time. May come onsite for infrequent meetings but none scheduled until after his Day 10 6/13. Pt denies further questions or concerns.

## 2021-05-18 NOTE — Telephone Encounter (Signed)
Reviewed RN Rolly Salter note agreed with plan of care.  Patient still symptomatic and working remote.

## 2021-05-30 ENCOUNTER — Telehealth: Payer: Self-pay

## 2021-05-30 NOTE — Telephone Encounter (Signed)
P.A. GENVOYA completed thru RX benefits

## 2021-06-01 NOTE — Telephone Encounter (Signed)
Patient contacted by telephone.  Patient reported all of his symptoms resolved end of last week but 5 days ago covid test still positive.  He spoke with Dr Hope Budds and was told he may test positive for up to one month after infection.  Patient stated he is going to test again end of this week to see if finally negative.  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  Discussed with patient mask wear/handwashing can protect others along with vaccination.  Discussed to check with covid tests he had purchased as some detect pieces of virus and others are live virus only.  Patient A&Ox3 spoke full sentences without difficulty no cough/congestion/throat clearing noted during 5 minute telephone call.  Patient verbalized understanding information/instructions, agreed with plan of care and had no further questions at this time.

## 2021-06-03 NOTE — Telephone Encounter (Signed)
P.A. approved til 12/09/38, sent pt mychart message

## 2021-08-17 ENCOUNTER — Other Ambulatory Visit: Payer: Self-pay

## 2021-08-17 ENCOUNTER — Ambulatory Visit: Payer: No Typology Code available for payment source | Admitting: Family Medicine

## 2021-08-17 ENCOUNTER — Encounter: Payer: Self-pay | Admitting: Family Medicine

## 2021-08-17 VITALS — BP 120/78 | HR 80 | Temp 98.4°F | Wt 168.2 lb

## 2021-08-17 DIAGNOSIS — Z209 Contact with and (suspected) exposure to unspecified communicable disease: Secondary | ICD-10-CM

## 2021-08-17 DIAGNOSIS — Z79899 Other long term (current) drug therapy: Secondary | ICD-10-CM

## 2021-08-17 DIAGNOSIS — B20 Human immunodeficiency virus [HIV] disease: Secondary | ICD-10-CM | POA: Diagnosis not present

## 2021-08-17 LAB — OB RESULTS CONSOLE GC/CHLAMYDIA: Chlamydia: NEGATIVE

## 2021-08-17 NOTE — Progress Notes (Signed)
   Subjective:    Patient ID: Gregory Hamilton, male    DOB: 12-25-1965, 55 y.o.   MRN: [id number]  HPI He is here for an interval evaluation to discuss STD testing.  He is now single and not dating anybody in particular and wants to be safe.  Presently he is having no sore throat, rectal issues, urinary complaints.  He continues on his HIV meds and has been very stable on them for several years.   Review of Systems     Objective:   Physical Exam Alert and in no distress otherwise not examined       Assessment & Plan:  Contact with or exposure to communicable disease - Plan: RPR, GC/Chlamydia Probe Amp([facility])  Human immunodeficiency virus (HIV) disease (HCC)  Encounter for long-term (current) use of high-risk medication Discussed treatment options for him concerning exposure.  Discussed safe sex with him in terms of condoms.  Also discussed checking at least twice per year if not more often based on sexual activity.

## 2021-08-18 LAB — RPR: RPR Ser Ql: NONREACTIVE

## 2021-08-19 LAB — GC/CHLAMYDIA PROBE AMP
Chlamydia trachomatis, NAA: NEGATIVE
Neisseria Gonorrhoeae by PCR: NEGATIVE

## 2021-09-01 ENCOUNTER — Other Ambulatory Visit: Payer: No Typology Code available for payment source

## 2021-09-01 ENCOUNTER — Other Ambulatory Visit: Payer: Self-pay

## 2021-09-01 ENCOUNTER — Other Ambulatory Visit: Payer: Self-pay | Admitting: Family Medicine

## 2021-09-01 DIAGNOSIS — Z209 Contact with and (suspected) exposure to unspecified communicable disease: Secondary | ICD-10-CM

## 2021-09-01 MED ORDER — AZITHROMYCIN 500 MG PO TABS: ORAL_TABLET | ORAL | 0 refills | Status: DC

## 2021-09-01 MED ORDER — AZITHROMYCIN 500 MG PO TABS: 500.0000 mg | ORAL_TABLET | Freq: Once | ORAL | Status: DC

## 2021-09-05 LAB — RPR+HIV+GC+CT PANEL
Chlamydia trachomatis, NAA: NEGATIVE
HIV Screen 4th Generation wRfx: REACTIVE
Neisseria Gonorrhoeae by PCR: NEGATIVE
RPR Ser Ql: NONREACTIVE

## 2021-09-05 LAB — HIV 1/2 AB DIFFERENTIATION
HIV 1 Ab: REACTIVE
HIV 2 Ab: NONREACTIVE
NOTE (HIV CONF MULTIP: POSITIVE — AB

## 2021-09-28 ENCOUNTER — Other Ambulatory Visit (INDEPENDENT_AMBULATORY_CARE_PROVIDER_SITE_OTHER): Payer: No Typology Code available for payment source

## 2021-09-28 ENCOUNTER — Other Ambulatory Visit: Payer: Self-pay

## 2021-09-28 DIAGNOSIS — Z23 Encounter for immunization: Secondary | ICD-10-CM | POA: Diagnosis not present

## 2021-11-15 ENCOUNTER — Ambulatory Visit: Payer: No Typology Code available for payment source | Admitting: Family Medicine

## 2021-11-15 ENCOUNTER — Other Ambulatory Visit: Payer: Self-pay

## 2021-11-15 VITALS — BP 128/82 | HR 97 | Temp 98.8°F | Wt 168.2 lb

## 2021-11-15 DIAGNOSIS — Z209 Contact with and (suspected) exposure to unspecified communicable disease: Secondary | ICD-10-CM

## 2021-11-15 LAB — OB RESULTS CONSOLE GC/CHLAMYDIA
Chlamydia: NEGATIVE
Chlamydia: POSITIVE

## 2021-11-15 NOTE — Progress Notes (Signed)
   Subjective:    Patient ID: Gregory Hamilton, male    DOB: [birth year], 55 y.o.   MRN: [id number]  HPI He complains of a 3-day history of rectal discomfort and occasionally seeing what appears to be mucus on his stool.  His last sexual activity was both oral and anal approximately 10 days ago with someone who he knows and interacts with regularly.  No sore throat, fever, chills..  No condoms were used.   Review of Systems     Objective:   Physical Exam Alert and in no distress.  Exam of the throat shows no lesions.  Neck is supple without adenopathy.  Anal area shows no lesions.       Assessment & Plan:  Contact with or exposure to communicable disease - Plan: GC/Chlamydia Probe Amp, RPR, GC/Chlamydia Probe Amp, GC/Chlamydia Probe Amp Reinforced the need for condoms in the future.

## 2021-11-16 LAB — GC/CHLAMYDIA PROBE AMP
Chlamydia trachomatis, NAA: NEGATIVE
Neisseria Gonorrhoeae by PCR: NEGATIVE

## 2021-11-16 LAB — RPR: RPR Ser Ql: NONREACTIVE

## 2021-11-17 LAB — GC/CHLAMYDIA PROBE AMP
Chlamydia trachomatis, NAA: NEGATIVE
Chlamydia trachomatis, NAA: POSITIVE — AB
Neisseria Gonorrhoeae by PCR: NEGATIVE
Neisseria Gonorrhoeae by PCR: NEGATIVE

## 2021-11-17 MED ORDER — DOXYCYCLINE HYCLATE 100 MG PO TABS: 100.0000 mg | ORAL_TABLET | Freq: Two times a day (BID) | ORAL | 0 refills | Status: DC

## 2021-11-17 NOTE — Addendum Note (Signed)
Addended by: Ronnald Nian on: 11/17/2021 01:04 PM   Modules accepted: Orders

## 2021-11-24 ENCOUNTER — Other Ambulatory Visit: Payer: Self-pay | Admitting: Family Medicine

## 2021-11-24 MED ORDER — DOXYCYCLINE HYCLATE 100 MG PO TABS: 100.0000 mg | ORAL_TABLET | Freq: Two times a day (BID) | ORAL | 0 refills | Status: DC

## 2021-11-24 NOTE — Progress Notes (Signed)
Doxycycline called in to ensure cure from his chlamydia.

## 2021-11-29 ENCOUNTER — Other Ambulatory Visit: Payer: Self-pay | Admitting: Family Medicine

## 2021-11-29 DIAGNOSIS — B20 Human immunodeficiency virus [HIV] disease: Secondary | ICD-10-CM

## 2021-11-29 MED ORDER — GENVOYA 150-150-200-10 MG PO TABS: 1.0000 | ORAL_TABLET | Freq: Every day | ORAL | 3 refills | Status: DC

## 2021-11-29 NOTE — Telephone Encounter (Signed)
[facility] is requesting to fill pt genvoya. Please advise [facility]

## 2021-12-19 ENCOUNTER — Telehealth: Payer: Self-pay | Admitting: *Deleted

## 2021-12-19 DIAGNOSIS — U071 COVID-19: Secondary | ICD-10-CM

## 2021-12-19 NOTE — Telephone Encounter (Signed)
Pt reported positive covid test today.   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 Felt slight congestion start but thought r/t dust/unboxing. Woke up this morning with same congestion and a HA. Felt same as when he had covid last year. Very mild sx. Not using any OTCs and does not want to right now. Did not discuss antivirals with pt for this reason. Did suggest nasal saline spray and as nasal rinse and pt is agreeable to this.    Day 0 1/9 Day 5 1/14 RTW next scheduled workday 1/16 thru Day 10 1/19  Reviewed isolation instructions and retesting recommendations for ending mask wear around other people in the household. Pt denies questions or concerns.

## 2021-12-24 ENCOUNTER — Encounter: Payer: Self-pay | Admitting: *Deleted

## 2021-12-24 NOTE — Telephone Encounter (Signed)
Patient returned call and stated feeling back to normal.  Discussed no eating in employee lunch room and to eat outside, in his car or 2 available rooms across from clinic if weather cold/inclement for eating outside through Day 10 1/19.  Strict mask wear use straw under mask for drinking beverages at workstation.  Patient A&Ox3 spoke full sentences without difficulty; no cough/throat clearing audible during 7 minute call.  Some nasal congestion audible.  HR notified cleared to return to work 1/16 with strict mask wear and no eating in lunch room through day 10. Patient verbalized understanding information/instructions, agreed with plan of care and had no further questions at this time.

## 2021-12-25 NOTE — Telephone Encounter (Signed)
Remote work today. Plans to work onsite Wed 1/18 as he is scheduled for phone coverage, will have strict mask use that day. Then work remote [employer] and return onsite Fri 1/20 with no mask use required. Only mild nasal congestion and infrequent throat clearing noted during call. Pt denies other sx, or needs or concerns.

## 2021-12-26 ENCOUNTER — Other Ambulatory Visit: Payer: Self-pay | Admitting: Family Medicine

## 2021-12-26 MED ORDER — DOXYCYCLINE HYCLATE 100 MG PO TABS: 100.0000 mg | ORAL_TABLET | Freq: Two times a day (BID) | ORAL | 1 refills | Status: DC

## 2021-12-26 NOTE — Telephone Encounter (Signed)
Reviewed RN Rolly Salter note, noted new remote schedule per patient this week agreed with plan of care.

## 2022-01-04 ENCOUNTER — Other Ambulatory Visit: Payer: Self-pay

## 2022-01-04 ENCOUNTER — Ambulatory Visit: Payer: Self-pay | Admitting: Registered Nurse

## 2022-01-04 VITALS — BP 120/72 | HR 73 | Temp 98.5°F

## 2022-01-04 DIAGNOSIS — K219 Gastro-esophageal reflux disease without esophagitis: Secondary | ICD-10-CM

## 2022-01-04 MED ORDER — OMEPRAZOLE 20 MG PO CPDR
20.0000 mg | DELAYED_RELEASE_CAPSULE | Freq: Every day | ORAL | 0 refills | Status: DC
Start: 2022-01-04 — End: 2023-03-07

## 2022-01-04 NOTE — Progress Notes (Signed)
Subjective:    Patient ID: Gregory Hamilton, male    DOB: June 14, 1966, 56 y.o.   MRN: [id number]  55y/o Caucasian established male pt c/o heartburn x1 week. Hx of same in past with certain foods but typically resolves same day/night. Now bothering him multiple nights, waking him up from sleep. Does not bother him during the day. Does sleep on side with 2 thin pillows. Feels like a bubble stuck in chest, like he needs to burp. Had positive covid test 12/18/21.  Denied mouth filling with reflux but feels it traveling up throat.  Typically eats dinner 4 hours prior to bed at  11pm.  Last night had toast with cheese slices and heartburn still woke him up.  Denied abdomen pain/diarrhea/vomiting/nausea.  Stooling daily.  Denied constipation/diarrhea/bloating.     Review of Systems  Constitutional:  Negative for activity change, appetite change, chills, diaphoresis, fatigue and fever.  HENT:  Negative for sore throat, trouble swallowing and voice change.   Eyes:  Negative for photophobia and visual disturbance.  Respiratory:  Negative for cough, shortness of breath, wheezing and stridor.   Cardiovascular:  Negative for chest pain.  Gastrointestinal:  Negative for abdominal distention, abdominal pain, blood in stool, constipation, diarrhea, nausea and vomiting.  Endocrine: Negative for cold intolerance and heat intolerance.  Genitourinary:  Negative for difficulty urinating.  Musculoskeletal:  Negative for arthralgias, gait problem, myalgias, neck pain and neck stiffness.  Skin:  Negative for rash.  Allergic/Immunologic: Positive for environmental allergies. Negative for food allergies.  Neurological:  Negative for dizziness, tremors, seizures, syncope, facial asymmetry, speech difficulty, weakness, light-headedness, numbness and headaches.  Hematological:  Negative for adenopathy. Does not bruise/bleed easily.  Psychiatric/Behavioral:  Negative for agitation, confusion and sleep disturbance.        Objective:   Physical Exam Vitals and nursing note reviewed.  Constitutional:      General: He is awake. He is not in acute distress.    Appearance: Normal appearance. He is well-developed and well-groomed. He is not ill-appearing, toxic-appearing or diaphoretic.  HENT:     Head: Normocephalic and atraumatic.     Jaw: There is normal jaw occlusion.     Salivary Glands: Right salivary gland is not diffusely enlarged. Left salivary gland is not diffusely enlarged.     Right Ear: Hearing and external ear normal.     Left Ear: Hearing and external ear normal.     Nose: Nose normal. No congestion or rhinorrhea.     Mouth/Throat:     Lips: Pink. No lesions.     Mouth: Mucous membranes are moist.     Pharynx: Oropharynx is clear.  Eyes:     General: Lids are normal. Vision grossly intact. Gaze aligned appropriately. Allergic shiner present. No scleral icterus.       Right eye: No discharge.        Left eye: No discharge.     Extraocular Movements: Extraocular movements intact.     Conjunctiva/sclera: Conjunctivae normal.     Pupils: Pupils are equal, round, and reactive to light.  Neck:     Trachea: Trachea normal.  Cardiovascular:     Rate and Rhythm: Normal rate and regular rhythm.     Pulses:          Radial pulses are 3+ on the right side and 2+ on the left side.  Pulmonary:     Effort: Pulmonary effort is normal. No respiratory distress.     Breath sounds: Normal breath sounds  and air entry. No stridor or transmitted upper airway sounds. No wheezing.     Comments: Spoke full sentences without difficulty; no cough observed in exam room Abdominal:     General: Abdomen is flat. There is no distension.     Palpations: Abdomen is soft.     Tenderness: There is no guarding.  Musculoskeletal:        General: Normal range of motion.     Cervical back: Normal range of motion and neck supple. No swelling, edema, deformity, erythema, signs of trauma, lacerations, rigidity, spasms,  torticollis or crepitus. No pain with movement. Normal range of motion.     Right lower leg: No edema.     Left lower leg: No edema.  Lymphadenopathy:     Head:     Right side of head: No submandibular or preauricular adenopathy.     Left side of head: No submandibular or preauricular adenopathy.     Cervical:     Right cervical: No superficial cervical adenopathy.    Left cervical: No superficial cervical adenopathy.  Skin:    General: Skin is warm and dry.     Capillary Refill: Capillary refill takes less than 2 seconds.     Coloration: Skin is not ashen, cyanotic, jaundiced, mottled, pale or sallow.     Findings: No abrasion, abscess, acne, bruising, burn, ecchymosis, erythema, signs of injury, laceration, lesion, petechiae, rash or wound.     Nails: There is no clubbing.  Neurological:     General: No focal deficit present.     Mental Status: He is alert and oriented to person, place, and time. Mental status is at baseline.     GCS: GCS eye subscore is 4. GCS verbal subscore is 5. GCS motor subscore is 6.     Cranial Nerves: Cranial nerves 2-12 are intact. No cranial nerve deficit, dysarthria or facial asymmetry.     Sensory: Sensation is intact.     Motor: Motor function is intact. No weakness, tremor, atrophy, abnormal muscle tone or seizure activity.     Coordination: Coordination is intact. Coordination normal.     Gait: Gait is intact. Gait normal.     Comments: In/out of chair without difficulty; gait sure and steady in clinic; bilateral hand grasp equal 5/5  Psychiatric:        Attention and Perception: Attention and perception normal.        Mood and Affect: Mood and affect normal.        Speech: Speech normal.        Behavior: Behavior normal. Behavior is cooperative.        Thought Content: Thought content normal.        Cognition and Memory: Cognition and memory normal.        Judgment: Judgment normal.          Assessment & Plan:   A-GERD  P-Discussed GI  upset has been seen with covid.  Trial omeprazole DR 20mg  po daily take with food x 2 weeks #90 RF0 then will stop if symptoms reoccur another 2 week trial and stop.  Discussed his genvoya can increase blood levels of omeprazole so caution if need to raise dose to 40mg .    Exitcare handout printed and given on GERD to patient.  Anti-reflux measures such as raising the head of the bed, avoiding tight clothing or belts, avoiding eating late at night and not lying down shortly after mealtime and achieving weight loss were discussed. Avoid ASA,  NSAID's, caffeine, peppermints, alcohol and tobacco. OTC H2 blockers and/or antacids are often very helpful for PRN use.  However, for chronic or daily symptoms, prescription strength H2 blockers or a trial of PPI's should be used.  Patient should alert me if there are persistent symptoms, dysphagia, weight loss or GI bleeding (bright red or black). Follow up with clinic provider or PCM if symptoms persist despite therapy. Patient verbalized agreement and understanding of treatment plan and had no further questions at this time.      P2:  Diet, Food avoidance that aggravate condition, and Fitness

## 2022-01-04 NOTE — Patient Instructions (Signed)
Gastroesophageal Reflux Disease, Adult ?Gastroesophageal reflux (GER) happens when acid from the stomach flows up into the tube that connects the mouth and the stomach (esophagus). Normally, food travels down the esophagus and stays in the stomach to be digested. However, when a person has GER, food and stomach acid sometimes move back up into the esophagus. If this becomes a more serious problem, the person may be diagnosed with a disease called gastroesophageal reflux disease (GERD). GERD occurs when the reflux: ?Happens often. ?Causes frequent or severe symptoms. ?Causes problems such as damage to the esophagus. ?When stomach acid comes in contact with the esophagus, the acid may cause inflammation in the esophagus. Over time, GERD may create small holes (ulcers) in the lining of the esophagus. ?What are the causes? ?This condition is caused by a problem with the muscle between the esophagus and the stomach (lower esophageal sphincter, or LES). Normally, the LES muscle closes after food passes through the esophagus to the stomach. When the LES is weakened or abnormal, it does not close properly, and that allows food and stomach acid to go back up into the esophagus. ?The LES can be weakened by certain dietary substances, medicines, and medical conditions, including: ?Tobacco use. ?Pregnancy. ?Having a hiatal hernia. ?Alcohol use. ?Certain foods and beverages, such as coffee, chocolate, onions, and peppermint. ?What increases the risk? ?You are more likely to develop this condition if you: ?Have an increased body weight. ?Have a connective tissue disorder. ?Take NSAIDs, such as ibuprofen. ?What are the signs or symptoms? ?Symptoms of this condition include: ?Heartburn. ?Difficult or painful swallowing and the feeling of having a lump in the throat. ?A bitter taste in the mouth. ?Bad breath and having a large amount of saliva. ?Having an upset or bloated stomach and belching. ?Chest pain. Different conditions can  cause chest pain. Make sure you see your health care provider if you experience chest pain. ?Shortness of breath or wheezing. ?Ongoing (chronic) cough or a nighttime cough. ?Wearing away of tooth enamel. ?Weight loss. ?How is this diagnosed? ?This condition may be diagnosed based on a medical history and a physical exam. To determine if you have mild or severe GERD, your health care provider may also monitor how you respond to treatment. You may also have tests, including: ?A test to examine your stomach and esophagus with a small camera (endoscopy). ?A test that measures the acidity level in your esophagus. ?A test that measures how much pressure is on your esophagus. ?A barium swallow or modified barium swallow test to show the shape, size, and functioning of your esophagus. ?How is this treated? ?Treatment for this condition may vary depending on how severe your symptoms are. Your health care provider may recommend: ?Changes to your diet. ?Medicine. ?Surgery. ?The goal of treatment is to help relieve your symptoms and to prevent complications. ?Follow these instructions at home: ?Eating and drinking ? ?Follow a diet as recommended by your health care provider. This may involve avoiding foods and drinks such as: ?Coffee and tea, with or without caffeine. ?Drinks that contain alcohol. ?Energy drinks and sports drinks. ?Carbonated drinks or sodas. ?Chocolate and cocoa. ?Peppermint and mint flavorings. ?Garlic and onions. ?Horseradish. ?Spicy and acidic foods, including peppers, chili powder, curry powder, vinegar, hot sauces, and barbecue sauce. ?Citrus fruit juices and citrus fruits, such as oranges, lemons, and limes. ?Tomato-based foods, such as red sauce, chili, salsa, and pizza with red sauce. ?Fried and fatty foods, such as donuts, french fries, potato chips, and high-fat dressings. ?  High-fat meats, such as hot dogs and fatty cuts of red and white meats, such as rib eye steak, sausage, ham, and  bacon. ?High-fat dairy items, such as whole milk, butter, and cream cheese. ?Eat small, frequent meals instead of large meals. ?Avoid drinking large amounts of liquid with your meals. ?Avoid eating meals during the 2-3 hours before bedtime. ?Avoid lying down right after you eat. ?Do not exercise right after you eat. ?Lifestyle ? ?Do not use any products that contain nicotine or tobacco. These products include cigarettes, chewing tobacco, and vaping devices, such as e-cigarettes. If you need help quitting, ask your health care provider. ?Try to reduce your stress by using methods such as yoga or meditation. If you need help reducing stress, ask your health care provider. ?If you are overweight, reduce your weight to an amount that is healthy for you. Ask your health care provider for guidance about a safe weight loss goal. ?General instructions ?Pay attention to any changes in your symptoms. ?Take over-the-counter and prescription medicines only as told by your health care provider. Do not take aspirin, ibuprofen, or other NSAIDs unless your health care provider told you to take these medicines. ?Wear loose-fitting clothing. Do not wear anything tight around your waist that causes pressure on your abdomen. ?Raise (elevate) the head of your bed about 6 inches (15 cm). You can use a wedge to do this. ?Avoid bending over if this makes your symptoms worse. ?Keep all follow-up visits. This is important. ?Contact a health care provider if: ?You have: ?New symptoms. ?Unexplained weight loss. ?Difficulty swallowing or it hurts to swallow. ?Wheezing or a persistent cough. ?A hoarse voice. ?Your symptoms do not improve with treatment. ?Get help right away if: ?You have sudden pain in your arms, neck, jaw, teeth, or back. ?You suddenly feel sweaty, dizzy, or light-headed. ?You have chest pain or shortness of breath. ?You vomit and the vomit is green, yellow, or black, or it looks like blood or coffee grounds. ?You faint. ?You  have stool that is red, bloody, or black. ?You cannot swallow, drink, or eat. ?These symptoms may represent a serious problem that is an emergency. Do not wait to see if the symptoms will go away. Get medical help right away. Call your local emergency services (911 in the [place] Do not drive yourself to the hospital. ?Summary ?Gastroesophageal reflux happens when acid from the stomach flows up into the esophagus. GERD is a disease in which the reflux happens often, causes frequent or severe symptoms, or causes problems such as damage to the esophagus. ?Treatment for this condition may vary depending on how severe your symptoms are. Your health care provider may recommend diet and lifestyle changes, medicine, or surgery. ?Contact a health care provider if you have new or worsening symptoms. ?Take over-the-counter and prescription medicines only as told by your health care provider. Do not take aspirin, ibuprofen, or other NSAIDs unless your health care provider told you to do so. ?Keep all follow-up visits as told by your health care provider. This is important. ?This information is not intended to replace advice given to you by your health care provider. Make sure you discuss any questions you have with your health care provider. ?Document Revised: 06/06/2020 Document Reviewed: 06/06/2020 ?Elsevier Patient Education ? 2022 [employer] ? ?

## 2022-01-04 NOTE — Telephone Encounter (Signed)
Patient seen in clinic today nasal congestion and throat clearing resolved but heartburn/GERD symptoms this week.  Omeprazole trial 20mg  DR po daily.  Will follow up in 1 week.

## 2022-01-14 NOTE — Telephone Encounter (Signed)
Patient contacted via telephone stated took omeprazole for 2 days and then symptoms resolved and he stopped medication and symptoms have not returned.  Patient feeling well denied questions or concerns.  Patient A&Ox3 spoke full sentences without difficulty; no congestion/cough/throat clearing during 1 minute call.  Covid Day 10 was 1/19 Encounter closed.

## 2022-03-13 ENCOUNTER — Encounter: Payer: PRIVATE HEALTH INSURANCE | Admitting: Family Medicine

## 2022-03-20 ENCOUNTER — Ambulatory Visit: Payer: No Typology Code available for payment source | Admitting: Family Medicine

## 2022-03-20 ENCOUNTER — Encounter: Payer: Self-pay | Admitting: Family Medicine

## 2022-03-20 VITALS — BP 110/72 | HR 100 | Temp 98.1°F | Ht 72.0 in | Wt 174.6 lb

## 2022-03-20 DIAGNOSIS — B20 Human immunodeficiency virus [HIV] disease: Secondary | ICD-10-CM

## 2022-03-20 DIAGNOSIS — Z0000 Encounter for general adult medical examination without abnormal findings: Secondary | ICD-10-CM | POA: Diagnosis not present

## 2022-03-20 DIAGNOSIS — L03211 Cellulitis of face: Secondary | ICD-10-CM

## 2022-03-20 DIAGNOSIS — Z79899 Other long term (current) drug therapy: Secondary | ICD-10-CM

## 2022-03-20 DIAGNOSIS — Z209 Contact with and (suspected) exposure to unspecified communicable disease: Secondary | ICD-10-CM

## 2022-03-20 DIAGNOSIS — Z87891 Personal history of nicotine dependence: Secondary | ICD-10-CM

## 2022-03-20 MED ORDER — DOXYCYCLINE HYCLATE 100 MG PO TABS: 100.0000 mg | ORAL_TABLET | Freq: Two times a day (BID) | ORAL | 0 refills | Status: DC

## 2022-03-20 MED ORDER — GENVOYA 150-150-200-10 MG PO TABS: 1.0000 | ORAL_TABLET | Freq: Every day | ORAL | 3 refills | Status: DC

## 2022-03-20 NOTE — Progress Notes (Signed)
? ?  Subjective:  ? ? Patient ID: Gregory Hamilton, male    DOB: Feb 05, 1966, 56 y.o.   MRN: [id number] ? ?HPI ?He is here for complete examination.  He does have underlying HIV and has been stable on Genvoya for several years.  He is sexually active and does need to have STD testing done.  Also last week he had difficulty with rhinorrhea and nasal irritation.  He was apparently picking some loose skin off of his nose and did cause slight bleeding.  After that he noted swelling of the nose spreading to the right cheek area.  He does have underlying allergies and uses Zyrtec for that.  He has had some slight stomach distress and found that 1 Prilosec did help with that.  He is having no difficulty with sleep.  He no longer smokes and drinks socially.  Otherwise his family and social history as well as health maintenance and immunizations was reviewed. ? ? ?Review of Systems  ?All other systems reviewed and are negative. ? ?   ?Objective:  ? Physical Exam ?Alert and in no distress.  Exam of the face shows the skin on the nose to be normal however the right cheek is erythematous and slightly swollen.  No vesicles are noted.  He does have some slight swelling to the medial aspect in the lower eyelid area.  No lesions present on the forehead.  Tympanic membranes and canals are normal. Pharyngeal area is normal. Neck is supple without adenopathy or thyromegaly. Cardiac exam shows a regular sinus rhythm without murmurs or gallops. Lungs are clear to auscultation.  Abdominal exam shows no masses or tenderness.  No inguinal or axillary adenopathy is noted. ? ? ? ? ?  ? ?Assessment & Plan:  ?Routine general medical examination at a health care facility - Plan: CBC with Differential/Platelet, Comprehensive metabolic panel, Lipid panel ? ?Human immunodeficiency virus (HIV) disease ([place]) - Plan: elvitegravir-cobicistat-emtricitabine-tenofovir (GENVOYA) 150-150-200-10 MG TABS tablet, HIV-1 RNA quant-no reflex-bld, T-helper cells (CD4)  count (not at [facility]) ? ?Contact with or exposure to communicable disease - Plan: RPR, GC/Chlamydia Probe Amp, Ct/GC NAA, Pharyngeal, CANCELED: GC/Chlamydia Probe Amp ? ?Encounter for long-term (current) use of high-risk medication ? ?Former smoker ? ?Cellulitis of face - Plan: doxycycline (VIBRA-TABS) 100 MG tablet ?He will keep me informed concerning the efficacy of the doxycycline.  Continue on his other medications. ? ?

## 2022-03-21 LAB — T-HELPER CELLS (CD4) COUNT (NOT AT ARMC)
% CD 4 Pos. Lymph.: 36.8 % (ref 30.8–58.5)
Absolute CD 4 Helper: 699 /uL (ref 359–1519)
Basophils Absolute: 0 10*3/uL (ref 0.0–0.2)
Basos: 0 %
EOS (ABSOLUTE): 0 10*3/uL (ref 0.0–0.4)
Eos: 0 %
Hematocrit: 44.3 % (ref 37.5–51.0)
Hemoglobin: 14.9 g/dL (ref 13.0–17.7)
Immature Grans (Abs): 0 10*3/uL (ref 0.0–0.1)
Immature Granulocytes: 0 %
Lymphocytes Absolute: 1.9 10*3/uL (ref 0.7–3.1)
Lymphs: 16 %
MCH: 32.1 pg (ref 26.6–33.0)
MCHC: 33.6 g/dL (ref 31.5–35.7)
MCV: 96 fL (ref 79–97)
Monocytes Absolute: 0.9 10*3/uL (ref 0.1–0.9)
Monocytes: 8 %
Neutrophils Absolute: 8.8 10*3/uL — ABNORMAL HIGH (ref 1.4–7.0)
Neutrophils: 76 %
Platelets: 244 10*3/uL (ref 150–450)
RBC: 4.64 x10E6/uL (ref 4.14–5.80)
RDW: 12.2 % (ref 11.6–15.4)
WBC: 11.7 10*3/uL — ABNORMAL HIGH (ref 3.4–10.8)

## 2022-03-21 LAB — COMPREHENSIVE METABOLIC PANEL
ALT: 13 IU/L (ref 0–44)
AST: 15 IU/L (ref 0–40)
Albumin/Globulin Ratio: 1.8 (ref 1.2–2.2)
Albumin: 4.8 g/dL (ref 3.8–4.9)
Alkaline Phosphatase: 69 IU/L (ref 44–121)
BUN/Creatinine Ratio: 9 (ref 9–20)
BUN: 12 mg/dL (ref 6–24)
Bilirubin Total: 0.6 mg/dL (ref 0.0–1.2)
CO2: 22 mmol/L (ref 20–29)
Calcium: 9.6 mg/dL (ref 8.7–10.2)
Chloride: 99 mmol/L (ref 96–106)
Creatinine, Ser: 1.35 mg/dL — ABNORMAL HIGH (ref 0.76–1.27)
Globulin, Total: 2.6 g/dL (ref 1.5–4.5)
Glucose: 98 mg/dL (ref 70–99)
Potassium: 4.4 mmol/L (ref 3.5–5.2)
Sodium: 138 mmol/L (ref 134–144)
Total Protein: 7.4 g/dL (ref 6.0–8.5)
eGFR: 63 mL/min/{1.73_m2} (ref >59)

## 2022-03-21 LAB — LIPID PANEL
Chol/HDL Ratio: 3 ratio (ref 0.0–5.0)
Cholesterol, Total: 179 mg/dL (ref 100–199)
HDL: 59 mg/dL (ref >39)
LDL Chol Calc (NIH): 99 mg/dL (ref 0–99)
Triglycerides: 116 mg/dL (ref 0–149)
VLDL Cholesterol Cal: 21 mg/dL (ref 5–40)

## 2022-03-21 LAB — HIV-1 RNA QUANT-NO REFLEX-BLD: HIV-1 RNA Viral Load: <20 copies/mL

## 2022-03-21 LAB — RPR: RPR Ser Ql: NONREACTIVE

## 2022-03-23 LAB — SPECIMEN STATUS REPORT

## 2022-03-23 LAB — GC/CHLAMYDIA PROBE AMP
Chlamydia trachomatis, NAA: NEGATIVE
Neisseria Gonorrhoeae by PCR: NEGATIVE

## 2022-03-23 LAB — CT/GC NAA, PHARYNGEAL

## 2022-03-30 ENCOUNTER — Encounter: Payer: Self-pay | Admitting: Family Medicine

## 2022-03-30 DIAGNOSIS — L03211 Cellulitis of face: Secondary | ICD-10-CM

## 2022-03-30 MED ORDER — DOXYCYCLINE HYCLATE 100 MG PO TABS: 100.0000 mg | ORAL_TABLET | Freq: Two times a day (BID) | ORAL | 2 refills | Status: DC

## 2022-04-06 ENCOUNTER — Encounter: Payer: Self-pay | Admitting: Family Medicine

## 2022-04-06 ENCOUNTER — Ambulatory Visit: Payer: No Typology Code available for payment source | Admitting: Family Medicine

## 2022-04-06 VITALS — BP 108/72 | HR 74 | Temp 97.9°F

## 2022-04-06 DIAGNOSIS — Z209 Contact with and (suspected) exposure to unspecified communicable disease: Secondary | ICD-10-CM

## 2022-04-06 LAB — OB RESULTS CONSOLE GC/CHLAMYDIA: Chlamydia: NEGATIVE

## 2022-04-06 MED ORDER — AZITHROMYCIN 500 MG PO TABS: 500.0000 mg | ORAL_TABLET | Freq: Every day | ORAL | 0 refills | Status: DC

## 2022-04-06 MED ORDER — AZITHROMYCIN 500 MG PO TABS: ORAL_TABLET | ORAL | 0 refills | Status: DC

## 2022-04-06 NOTE — Addendum Note (Signed)
Addended by: Ronnald Nian on: 04/06/2022 10:57 AM ? ? Modules accepted: Orders ? ?

## 2022-04-06 NOTE — Progress Notes (Signed)
? ?  Subjective:  ? ? Patient ID: Gregory Hamilton, male    DOB: January 27, 1966, 56 y.o.   MRN: [id number] ? ?HPI ?He had unprotected sex approximately 1 week ago and was not told that he he was exposed to gonorrhea.  He has had no sore throat, urinary or rectal symptoms.  He continues on his HIV meds and did take doxycycline as a preventative for that sexual activity. ? ? ?Review of Systems ? ?   ?Objective:  ? Physical Exam ?Alert and in no distress.  Throat and rectal GC chlamydia culture taken. ? ? ? ?   ?Assessment & Plan:  ?Contact with or exposure to communicable disease - Plan: GC/Chlamydia Probe Amp, GC/Chlamydia Probe Amp, RPR, azithromycin (ZITHROMAX) 500 MG tablet ?I explained that I think is important to find out whether he is asymptomatic and positive.  I will go ahead and treat him with Zithromax.  He has an allergy to cephalosporins.  Discussed possible breakthrough infection due to resistance to both azithromycin and doxycycline. ? ?

## 2022-04-07 LAB — RPR: RPR Ser Ql: NONREACTIVE

## 2022-04-08 LAB — GC/CHLAMYDIA PROBE AMP
Chlamydia trachomatis, NAA: NEGATIVE
Neisseria Gonorrhoeae by PCR: NEGATIVE

## 2022-04-09 LAB — CT/GC NAA, PHARYNGEAL
C TRACH RRNA NPH QL PCR: NEGATIVE
N GONORRHOEA RRNA NPH QL PCR: NEGATIVE

## 2022-06-29 ENCOUNTER — Encounter: Payer: No Typology Code available for payment source | Admitting: Family Medicine

## 2022-07-05 ENCOUNTER — Ambulatory Visit: Payer: No Typology Code available for payment source | Admitting: Family Medicine

## 2022-07-05 ENCOUNTER — Encounter: Payer: Self-pay | Admitting: Family Medicine

## 2022-07-05 VITALS — BP 120/84 | HR 74 | Wt 167.0 lb

## 2022-07-05 DIAGNOSIS — Z209 Contact with and (suspected) exposure to unspecified communicable disease: Secondary | ICD-10-CM

## 2022-07-05 LAB — OB RESULTS CONSOLE GC/CHLAMYDIA: Chlamydia: NEGATIVE

## 2022-07-05 MED ORDER — CEFTRIAXONE SODIUM 500 MG IJ SOLR
500.0000 mg | Freq: Once | INTRAMUSCULAR | Status: AC
  Administered 2022-07-05: 500 mg via INTRAMUSCULAR

## 2022-07-05 MED ORDER — DOXYCYCLINE HYCLATE 100 MG PO TABS: 100.0000 mg | ORAL_TABLET | Freq: Two times a day (BID) | ORAL | 0 refills | Status: DC

## 2022-07-05 NOTE — Progress Notes (Signed)
    Subjective:    Patient ID: Gregory Hamilton, male    DOB: 1966/03/05, 56 y.o.   MRN: [id number]  HPI He is here for 4 evaluation and treatment for possible STI.  He had sexual activity on Sunday and 2 days ago noted slight rectal discomfort but no sore throat, penile discharge fever or chills.  He continues on his HIV medications and has taken some doxycycline as a preventative for STI prior to that.   Review of Systems     Objective:   Physical Exam  And in no distress.  Throat is clear.  Rectal alert exam appears normal     Assessment & Plan:  Contact with or exposure to communicable disease - Plan: doxycycline (VIBRA-TABS) 100 MG tablet, GC/Chlamydia Probe Amp, GC/Chlamydia Probe Amp, GC/Chlamydia Probe Amp, RPR, cefTRIAXone (ROCEPHIN) injection 500 mg I have decided to go ahead and treat him rather than wait for the results.

## 2022-07-06 LAB — RPR: RPR Ser Ql: NONREACTIVE

## 2022-07-09 LAB — GC/CHLAMYDIA PROBE AMP
Chlamydia trachomatis, NAA: NEGATIVE
Chlamydia trachomatis, NAA: NEGATIVE
Chlamydia trachomatis, NAA: NEGATIVE
Neisseria Gonorrhoeae by PCR: NEGATIVE
Neisseria Gonorrhoeae by PCR: NEGATIVE
Neisseria Gonorrhoeae by PCR: NEGATIVE

## 2022-07-31 ENCOUNTER — Other Ambulatory Visit: Payer: Self-pay | Admitting: Family Medicine

## 2022-07-31 DIAGNOSIS — Z209 Contact with and (suspected) exposure to unspecified communicable disease: Secondary | ICD-10-CM

## 2022-07-31 MED ORDER — DOXYCYCLINE HYCLATE 100 MG PO TABS: ORAL_TABLET | ORAL | 1 refills | Status: DC

## 2022-08-15 ENCOUNTER — Encounter: Payer: Self-pay | Admitting: Internal Medicine

## 2022-08-17 ENCOUNTER — Ambulatory Visit: Payer: No Typology Code available for payment source | Admitting: Family Medicine

## 2022-08-17 ENCOUNTER — Encounter: Payer: Self-pay | Admitting: Family Medicine

## 2022-08-17 VITALS — BP 120/84 | HR 90 | Temp 98.1°F | Wt 167.4 lb

## 2022-08-17 DIAGNOSIS — Z209 Contact with and (suspected) exposure to unspecified communicable disease: Secondary | ICD-10-CM | POA: Diagnosis not present

## 2022-08-17 NOTE — Progress Notes (Signed)
   Subjective:    Patient ID: Gregory Hamilton, male    DOB: 10/26/1966, 56 y.o.   MRN: [id number]  HPI He is here for consult concerning STD exposure.  Approximately 9 days ago he had a sexual experience without condoms.  He was then called the other day saying he has been exposed to gonorrhea.  He is having no sore throat, fever, chills, rectal discomfort, dysuria or frequency.  He continues on his HIV medications and is also on Doxy prep   Review of Systems     Objective:   Physical Exam Alert and in no distress.  Throat and anal swabs were taken.       Assessment & Plan:  Contact with or exposure to communicable disease - Plan: RPR, GC/Chlamydia Probe Amp, GC/Chlamydia Probe Amp, GC/Chlamydia Probe Amp Discussed limiting his sexual exposure and he is at least thinking about it.

## 2022-08-18 LAB — RPR: RPR Ser Ql: NONREACTIVE

## 2022-08-19 LAB — GC/CHLAMYDIA PROBE AMP
Chlamydia trachomatis, NAA: NEGATIVE
Neisseria Gonorrhoeae by PCR: NEGATIVE

## 2022-08-20 LAB — GC/CHLAMYDIA PROBE AMP
Chlamydia trachomatis, NAA: NEGATIVE
Neisseria Gonorrhoeae by PCR: POSITIVE — AB

## 2022-08-21 ENCOUNTER — Other Ambulatory Visit: Payer: Self-pay | Admitting: Family Medicine

## 2022-08-21 ENCOUNTER — Other Ambulatory Visit (INDEPENDENT_AMBULATORY_CARE_PROVIDER_SITE_OTHER): Payer: No Typology Code available for payment source

## 2022-08-21 DIAGNOSIS — A549 Gonococcal infection, unspecified: Secondary | ICD-10-CM

## 2022-08-21 DIAGNOSIS — Z209 Contact with and (suspected) exposure to unspecified communicable disease: Secondary | ICD-10-CM

## 2022-08-21 DIAGNOSIS — A749 Chlamydial infection, unspecified: Secondary | ICD-10-CM

## 2022-08-21 MED ORDER — CEFTRIAXONE SODIUM 500 MG IJ SOLR: 500.0000 mg | Freq: Once | INTRAMUSCULAR | Status: DC

## 2022-08-21 MED ORDER — CEFTRIAXONE SODIUM 500 MG IJ SOLR
500.0000 mg | Freq: Once | INTRAMUSCULAR | Status: AC
  Administered 2022-08-21: 500 mg via INTRAMUSCULAR

## 2022-08-21 NOTE — Progress Notes (Signed)
He had recent testing done which does show evidence of gonorrhea.  I will treat him with Rocephin which she has had in the past.

## 2022-08-30 ENCOUNTER — Encounter: Payer: Self-pay | Admitting: Registered Nurse

## 2022-08-30 ENCOUNTER — Telehealth: Payer: Self-pay | Admitting: Registered Nurse

## 2022-08-30 DIAGNOSIS — U071 COVID-19: Secondary | ICD-10-CM

## 2022-08-30 MED ORDER — SALINE SPRAY 0.65 % NA SOLN: 2.0000 | NASAL | 0 refills | Status: DC

## 2022-08-30 MED ORDER — FLUTICASONE PROPIONATE 50 MCG/ACT NA SUSP: 1.0000 | Freq: Two times a day (BID) | NASAL | 0 refills | Status: DC

## 2022-08-30 NOTE — Telephone Encounter (Signed)
Patient sent NP message today "I'm not sure if I'm still required to report but I tested positive for covid yesterday. I've spoken with my doctor and he's told me I need to isolate for 5 days. What are the requirements for returning to work?"  pt reported positive home test 08/30/2022 Day 0 congestion/head cold symptoms left work early as started not feeling well at work.  Patient did not report any close contacts within 6 ft for longer than `15 minutes yesterday at work.  Discussed with patient to notify any close contacts in the past 48 hours CDC recommendations to test in 6 days and isolate/wear mask when around others for 10 days.  If symptoms start test earlier.  Denied known sick contacts.   Pt began quarantine at that time after positive test. Patient did not develop symptoms of  trouble breathing, chest pain, nausea, vomiting, diarrhea, sore throat, HA, body aches, fever or chills.   5 day quarantine per [facility] recommendations. Day 1 of quarantine was 08/30/22  Day 5 09/03/25 estimated RTW 09/04/22 with strict mask wear through day 10 09/08/22.   Patient notified he can eat in his car when onsite next week or outside tables or relaxation/private room across from clinic with door closed.  Reviewed possible Covid symptoms including cough, shortness of breath with exertion or at rest, runny nose, congestion, sinus pain/pressure, sore throat, fever/chills, body aches, fatigue, loss of taste/smell, GI symptoms of nausea/vomiting/diarrhea. Also reviewed same day/emergent eval/ER precautions of dizziness/syncope, confusion, blue tint to lips/face, severe shortness of breath/difficulty breathing/wheezing. Patient reported he understood as not the first time he has had covid infection.  Patient to isolate in own room and if possible use only one bathroom if living with others in home.  Wear mask when out of room to help prevent spread to others in household.  Sanitize high touch surfaces with lysol/chlorox/bleach spray  or wipes daily as viruses are known to live on surfaces from 24 hours to days.  Discussed nasal saline 2 sprays each nostril q2h prn congestion/mucous OTC and new rx for fluticasone nasal 35mcg 1 spray each nostril BID prn rhinitis/congestion nasal/sinus pressure/pain.  Discussed with patient it is available OTC also.  Short term use due to [place] use.  Notified patient fluticasone is not on [employer] formulary any longer as cost to purchase from supplier 2-3 times OTC costs at this time so no longer carried in clinic; a bottle may be purchased at [facility] for less than $7.  Avoid dehydration and drink water to keep urine pale yellow clear and voiding every 2-4 hours while awake.  Patient alert and oriented x3, spoke full sentences without difficulty.  Some nasal congestion audible during 15 minute telephone call.  No audible cough or throat clearing. Discussed with patient he can contact me at [phone] or email [email or web address] when [facility] office closed if he needs assistance. Discussed clinic closed 9/22 due to flu clinic and no nurse in clinic week of 9/25-9/29 and NP only clinic 09-12 9/26 and [phone] 9/28  [employer] handouts on covid home care/quarantine sent to patient my chart.  Patient refused covid antivirals and stated he discussed with his PCM also.   Pt verbalized understanding and agreement with plan of care. No further questions/concerns at this time. Pt reminded to contact clinic with any changes in symptoms or questions/concerns. HR notified patient to work remote/quarantine through Day 5 RTW estimated Day 6 with strict mask wear through Day 10 and no eating in employee lunch room.  Estimated return to work onsite 09/04/22 will follow up with patient again 09/03/22 via telephone.

## 2022-09-05 NOTE — Telephone Encounter (Signed)
Spoke with patient via telephone feeling well.  Was scheduled to work remote today and be onsite 9/27-9/28/23.  Discussed mask wear and no eating in employee lunch room through day 10.  Patient asked if he can remove mask at his cubicle and discussed no must wear it as sharing air without others around him and could spread infection.  Discussed if patient has 2 negative covid tests 24 hours apart prior to day 10 may stop wearing mask.  He would need to send me pictures of each test result on day test taken.  Patient reported symptoms improved still some congestion/post nasal drip but otherwise feeling well.  Denied concerns at this time.  Discussed I am in clinic Thursday 11-2 if he would like me to check lung sounds/sp02.  Duration of call 5 minutes.  No cough audible; some nasal congestion audible.  A&Ox3 spoke full sentences without difficulty.  Patient verbalized understanding information/instructions, agreed with plan of care and had no further questions at this time.

## 2022-09-11 ENCOUNTER — Ambulatory Visit: Payer: No Typology Code available for payment source

## 2022-09-11 DIAGNOSIS — Z23 Encounter for immunization: Secondary | ICD-10-CM

## 2022-09-18 ENCOUNTER — Encounter: Payer: Self-pay | Admitting: Internal Medicine

## 2022-10-01 ENCOUNTER — Encounter: Payer: Self-pay | Admitting: Internal Medicine

## 2022-10-08 NOTE — Telephone Encounter (Signed)
Patient returned call stated all symptoms resolved and feeling 100% normal.  Denied further questions or concerns.  Encounter closed.

## 2022-10-15 ENCOUNTER — Other Ambulatory Visit: Payer: Self-pay | Admitting: Family Medicine

## 2022-10-15 DIAGNOSIS — Z209 Contact with and (suspected) exposure to unspecified communicable disease: Secondary | ICD-10-CM

## 2022-10-15 NOTE — Telephone Encounter (Signed)
Refill request

## 2022-11-13 ENCOUNTER — Encounter: Payer: Self-pay | Admitting: Family Medicine

## 2022-11-13 ENCOUNTER — Ambulatory Visit: Payer: No Typology Code available for payment source | Admitting: Family Medicine

## 2022-11-13 VITALS — BP 122/90 | HR 79 | Wt 171.8 lb

## 2022-11-13 DIAGNOSIS — Z209 Contact with and (suspected) exposure to unspecified communicable disease: Secondary | ICD-10-CM

## 2022-11-13 NOTE — Progress Notes (Signed)
   Subjective:    Patient ID: Gregory Hamilton, male    DOB: [birth year], 56 y.o.   MRN: [id number]  HPI He is here for follow-up for STD prevention.  His most recent encounter was on Sunday.  He is having no fever, chills, sore throat, rectal or penile symptoms.   Review of Systems     Objective:   Physical Exam Throat is clear.  Anal area shows no lesions.  Cultures were taken from throat anus and will check urine.       Assessment & Plan:  Contact with or exposure to communicable disease - Plan: RPR, GC/Chlamydia Probe Amp, GC/Chlamydia Probe Amp, GC/Chlamydia Probe Amp

## 2022-11-14 ENCOUNTER — Encounter: Payer: Self-pay | Admitting: Registered Nurse

## 2022-11-14 ENCOUNTER — Telehealth: Payer: Self-pay | Admitting: Registered Nurse

## 2022-11-14 DIAGNOSIS — Z23 Encounter for immunization: Secondary | ICD-10-CM

## 2022-11-14 LAB — RPR: RPR Ser Ql: NONREACTIVE

## 2022-11-14 NOTE — Telephone Encounter (Signed)
Epic reviewed patient cleared for shingrix vaccination with RN [facility] tomorrow as long as no acute illness arises.  Last vaccination Oct 2023 flu.

## 2022-11-15 ENCOUNTER — Ambulatory Visit: Payer: No Typology Code available for payment source | Admitting: Occupational Medicine

## 2022-11-15 DIAGNOSIS — Z23 Encounter for immunization: Secondary | ICD-10-CM

## 2022-11-15 LAB — GC/CHLAMYDIA PROBE AMP
Chlamydia trachomatis, NAA: NEGATIVE
Chlamydia trachomatis, NAA: NEGATIVE
Chlamydia trachomatis, NAA: NEGATIVE
Neisseria Gonorrhoeae by PCR: NEGATIVE
Neisseria Gonorrhoeae by PCR: NEGATIVE
Neisseria Gonorrhoeae by PCR: NEGATIVE

## 2022-11-15 NOTE — Addendum Note (Signed)
Addended by: Sissy Hoff M on: 11/15/2022 05:09 PM   Modules accepted: Orders

## 2022-11-16 NOTE — Telephone Encounter (Signed)
Patient had shingrix #1 with RN [facility] 11/15/22

## 2022-12-27 ENCOUNTER — Other Ambulatory Visit: Payer: Self-pay | Admitting: Family Medicine

## 2022-12-27 DIAGNOSIS — Z209 Contact with and (suspected) exposure to unspecified communicable disease: Secondary | ICD-10-CM

## 2022-12-27 NOTE — Telephone Encounter (Signed)
Is it ok to refill?

## 2023-01-17 ENCOUNTER — Ambulatory Visit: Payer: No Typology Code available for payment source | Admitting: Occupational Medicine

## 2023-01-17 DIAGNOSIS — Z23 Encounter for immunization: Secondary | ICD-10-CM

## 2023-01-17 NOTE — Progress Notes (Signed)
Patient given shingles vaccine tolerated well.  

## 2023-02-07 ENCOUNTER — Ambulatory Visit: Payer: No Typology Code available for payment source | Admitting: Family Medicine

## 2023-02-07 ENCOUNTER — Encounter: Payer: Self-pay | Admitting: Family Medicine

## 2023-02-07 VITALS — BP 128/84 | HR 83 | Wt 170.2 lb

## 2023-02-07 DIAGNOSIS — Z1211 Encounter for screening for malignant neoplasm of colon: Secondary | ICD-10-CM

## 2023-02-07 DIAGNOSIS — B20 Human immunodeficiency virus [HIV] disease: Secondary | ICD-10-CM

## 2023-02-07 DIAGNOSIS — Z209 Contact with and (suspected) exposure to unspecified communicable disease: Secondary | ICD-10-CM

## 2023-02-07 DIAGNOSIS — Z8639 Personal history of other endocrine, nutritional and metabolic disease: Secondary | ICD-10-CM

## 2023-02-07 DIAGNOSIS — N529 Male erectile dysfunction, unspecified: Secondary | ICD-10-CM

## 2023-02-07 NOTE — Progress Notes (Signed)
   Subjective:    Patient ID: Gregory Hamilton, male    DOB: 09/06/1966, 57 y.o.   MRN: [id number]  HPI He is here for interval evaluation.  He would like STD testing.  He continues on his HIV medicine without difficulty.  He has had no sore throat, dysuria or discharge or rectal discomfort.  He does use his HIV meds as well as doxycycline.  Indicates that he has a previous history of hypogonadism and in the past had been tried on topical as well as patches however side effects made it uncomfortable for him.  He has not been on replacement in quite some time.  He has noted some erectile difficulty recently.  He does drink and does use THC but has never had difficulty with that in the past.  He also needs follow-up on colon cancer screening.   Review of Systems     Objective:   Physical Exam Alert and in no distress.  Throat is clear.  Penis and testes are normal.  Anal exam is normal.       Assessment & Plan:  Contact with or exposure to communicable disease - Plan: RPR, GC/Chlamydia Probe Amp, GC/Chlamydia Probe Amp  Human immunodeficiency virus (HIV) disease ([place])  Erectile dysfunction, unspecified erectile dysfunction type  History of hypogonadism - Plan: Testosterone I discussed erectile dysfunction with him in regard to smoking and drinking.  Also the effect of testosterone concerning this and the fact that that is only peripherally involved in erectile function.  I explained that if his level is low, we will need to get another 1 to qualify him again for testosterone replacement.

## 2023-02-08 LAB — TESTOSTERONE: Testosterone: 418 ng/dL (ref 264–916)

## 2023-02-08 LAB — RPR: RPR Ser Ql: NONREACTIVE

## 2023-02-11 ENCOUNTER — Other Ambulatory Visit (INDEPENDENT_AMBULATORY_CARE_PROVIDER_SITE_OTHER): Payer: No Typology Code available for payment source

## 2023-02-11 DIAGNOSIS — A549 Gonococcal infection, unspecified: Secondary | ICD-10-CM

## 2023-02-11 LAB — GC/CHLAMYDIA PROBE AMP
Chlamydia trachomatis, NAA: NEGATIVE
Neisseria Gonorrhoeae by PCR: POSITIVE — AB

## 2023-02-11 MED ORDER — CEFTRIAXONE SODIUM 500 MG IJ SOLR
500.0000 mg | Freq: Once | INTRAMUSCULAR | Status: AC
  Administered 2023-02-11: 500 mg via INTRAMUSCULAR

## 2023-02-19 LAB — GC/CHLAMYDIA PROBE AMP
Chlamydia trachomatis, NAA: NEGATIVE
Neisseria Gonorrhoeae by PCR: UNDETERMINED — AB

## 2023-02-26 ENCOUNTER — Other Ambulatory Visit: Payer: Self-pay | Admitting: Family Medicine

## 2023-02-26 DIAGNOSIS — Z209 Contact with and (suspected) exposure to unspecified communicable disease: Secondary | ICD-10-CM

## 2023-03-07 ENCOUNTER — Ambulatory Visit: Payer: No Typology Code available for payment source | Admitting: Family Medicine

## 2023-03-07 VITALS — BP 110/64 | HR 100 | Wt 169.2 lb

## 2023-03-07 DIAGNOSIS — Z202 Contact with and (suspected) exposure to infections with a predominantly sexual mode of transmission: Secondary | ICD-10-CM

## 2023-03-07 MED ORDER — CEFTRIAXONE SODIUM 500 MG IJ SOLR
500.0000 mg | Freq: Once | INTRAMUSCULAR | Status: AC
  Administered 2023-03-07: 500 mg via INTRAMUSCULAR

## 2023-03-07 NOTE — Progress Notes (Signed)
   Subjective:    Patient ID: Gregory Hamilton, male    DOB: 1966/06/26, 57 y.o.   MRN: [id number]  HPI He was seen on February 29 and subsequent testing was positive for gonorrhea.  He was given Rocephin.  He states that roughly 1 week after that he had a sexual encounter with someone he said he was tested but treatment was not.  Since that time he has gotten tested and is now on medication.  Gregory Hamilton states that he had some dysuria that started earlier in the week with some slight discharge.  No sore throat or rectal discomfort.   Review of Systems     Objective:   Physical Exam Throat is clear.  Culture taken.  Anal exam shows no lesions.  Culture taken.       Assessment & Plan:  Venereal disease contact - Plan: GC/Chlamydia Probe Amp, GC/Chlamydia Probe Amp, GC/Chlamydia Probe Amp, RPR, cefTRIAXone (ROCEPHIN) injection 500 mg I will go ahead and treat him again and encouraged him to not have any sexual contact with anyone for the next 2 weeks and I will retest him to ensure that we have a cure.

## 2023-03-08 LAB — RPR: RPR Ser Ql: NONREACTIVE

## 2023-03-09 LAB — GC/CHLAMYDIA PROBE AMP
Chlamydia trachomatis, NAA: NEGATIVE
Neisseria Gonorrhoeae by PCR: NEGATIVE

## 2023-03-10 ENCOUNTER — Other Ambulatory Visit: Payer: Self-pay | Admitting: Family Medicine

## 2023-03-10 DIAGNOSIS — B20 Human immunodeficiency virus [HIV] disease: Secondary | ICD-10-CM

## 2023-03-11 LAB — GC/CHLAMYDIA PROBE AMP
Chlamydia trachomatis, NAA: NEGATIVE
Neisseria Gonorrhoeae by PCR: NEGATIVE

## 2023-03-11 NOTE — Telephone Encounter (Signed)
Is this okay to refill? 

## 2023-03-12 LAB — COLOGUARD: COLOGUARD: NEGATIVE

## 2023-03-17 LAB — GC/CHLAMYDIA PROBE AMP

## 2023-04-22 ENCOUNTER — Other Ambulatory Visit: Payer: Self-pay | Admitting: Family Medicine

## 2023-04-22 DIAGNOSIS — Z209 Contact with and (suspected) exposure to unspecified communicable disease: Secondary | ICD-10-CM

## 2023-05-21 ENCOUNTER — Encounter: Payer: Self-pay | Admitting: Family Medicine

## 2023-05-21 ENCOUNTER — Ambulatory Visit: Payer: No Typology Code available for payment source | Admitting: Family Medicine

## 2023-05-21 VITALS — BP 110/88 | HR 90 | Temp 98.1°F | Resp 16 | Wt 164.0 lb

## 2023-05-21 DIAGNOSIS — L723 Sebaceous cyst: Secondary | ICD-10-CM

## 2023-05-21 DIAGNOSIS — Z209 Contact with and (suspected) exposure to unspecified communicable disease: Secondary | ICD-10-CM | POA: Diagnosis not present

## 2023-05-21 NOTE — Addendum Note (Signed)
Addended by: Ronnald Nian on: 05/21/2023 12:43 PM   Modules accepted: Level of Service

## 2023-05-21 NOTE — Progress Notes (Addendum)
   Subjective:    Patient ID: Gregory Hamilton, male    DOB: 08/31/1966, 57 y.o.   MRN: [id number]  HPI He is here for excision of a sebaceous cyst present on the right posterolateral neck area that is apparently growing but not necessarily causing pain at the present time.  He also needs to have STD testing done.  He continues on Genvoya and is taking Vibra-Tabs for prevention.  He has had no burning, dysuria, rectal or throat discomfort.   Review of Systems     Objective:   Physical Exam Exam of the right posterior neck area does show a 1 cm raised nontender lesion.  No drainage noted.       Assessment & Plan:  Sebaceous cyst  Contact with or exposure to communicable disease - Plan: GC/Chlamydia Probe Amp, GC/Chlamydia Probe Amp, RPR, GC/Chlamydia Probe Amp The area was injected with Xylocaine and epinephrine.  A 1-1/2 cm incision was made.  The whitish material was excised without difficulty and the sac was removed easily.  Explore of the lesion shows no residual. Throat and anal culture was frozen GC chlamydia was taken.

## 2023-05-22 LAB — GC/CHLAMYDIA PROBE AMP

## 2023-05-22 LAB — RPR: RPR Ser Ql: NONREACTIVE

## 2023-05-24 LAB — GC/CHLAMYDIA PROBE AMP
Chlamydia trachomatis, NAA: NEGATIVE
Chlamydia trachomatis, NAA: NEGATIVE
Neisseria Gonorrhoeae by PCR: NEGATIVE
Neisseria Gonorrhoeae by PCR: NEGATIVE

## 2023-05-26 LAB — CT/GC NAA, PHARYNGEAL
C TRACH RRNA NPH QL PCR: NEGATIVE
N GONORRHOEA RRNA NPH QL PCR: NEGATIVE

## 2023-05-26 LAB — SPECIMEN STATUS REPORT

## 2023-06-27 ENCOUNTER — Other Ambulatory Visit: Payer: Self-pay | Admitting: Family Medicine

## 2023-06-27 DIAGNOSIS — Z209 Contact with and (suspected) exposure to unspecified communicable disease: Secondary | ICD-10-CM

## 2023-07-01 ENCOUNTER — Other Ambulatory Visit: Payer: No Typology Code available for payment source | Admitting: Occupational Medicine

## 2023-07-02 ENCOUNTER — Encounter: Payer: Self-pay | Admitting: Registered Nurse

## 2023-07-02 ENCOUNTER — Other Ambulatory Visit: Payer: Self-pay | Admitting: Registered Nurse

## 2023-07-02 ENCOUNTER — Other Ambulatory Visit: Payer: No Typology Code available for payment source | Admitting: Occupational Medicine

## 2023-07-02 ENCOUNTER — Ambulatory Visit: Payer: No Typology Code available for payment source | Admitting: Registered Nurse

## 2023-07-02 ENCOUNTER — Ambulatory Visit: Payer: Self-pay | Admitting: Registered Nurse

## 2023-07-02 DIAGNOSIS — Z0000 Encounter for general adult medical examination without abnormal findings: Secondary | ICD-10-CM

## 2023-07-02 DIAGNOSIS — R7989 Other specified abnormal findings of blood chemistry: Secondary | ICD-10-CM

## 2023-07-02 DIAGNOSIS — D649 Anemia, unspecified: Secondary | ICD-10-CM

## 2023-07-03 ENCOUNTER — Encounter: Payer: Self-pay | Admitting: Registered Nurse

## 2023-07-03 LAB — HGB A1C W/O EAG: Hgb A1c MFr Bld: 4.9 % (ref 4.8–5.6)

## 2023-07-03 LAB — CMP12+LP+TP+TSH+6AC+CBC/D/PLT
ALT: 12 IU/L (ref 0–44)
AST: 13 IU/L (ref 0–40)
Albumin: 4.1 g/dL (ref 3.8–4.9)
Alkaline Phosphatase: 54 IU/L (ref 44–121)
BUN/Creatinine Ratio: 10 (ref 9–20)
BUN: 10 mg/dL (ref 6–24)
Basophils Absolute: 0 10*3/uL (ref 0.0–0.2)
Basos: 1 %
Bilirubin Total: 0.5 mg/dL (ref 0.0–1.2)
Calcium: 8.9 mg/dL (ref 8.7–10.2)
Chloride: 101 mmol/L (ref 96–106)
Chol/HDL Ratio: 3 ratio (ref 0.0–5.0)
Cholesterol, Total: 154 mg/dL (ref 100–199)
Creatinine, Ser: 1.04 mg/dL (ref 0.76–1.27)
EOS (ABSOLUTE): 0.1 10*3/uL (ref 0.0–0.4)
Eos: 1 %
Estimated CHD Risk: <0.5 times avg. (ref 0.0–1.0)
Free Thyroxine Index: 2.8 (ref 1.2–4.9)
GGT: 18 IU/L (ref 0–65)
Globulin, Total: 2.6 g/dL (ref 1.5–4.5)
Glucose: 99 mg/dL (ref 70–99)
HDL: 51 mg/dL (ref >39)
Hematocrit: 38.9 % (ref 37.5–51.0)
Hemoglobin: 12.8 g/dL — ABNORMAL LOW (ref 13.0–17.7)
Immature Grans (Abs): 0 10*3/uL (ref 0.0–0.1)
Immature Granulocytes: 0 %
Iron: 45 ug/dL (ref 38–169)
LDH: 167 IU/L (ref 121–224)
LDL Chol Calc (NIH): 87 mg/dL (ref 0–99)
Lymphocytes Absolute: 1.8 10*3/uL (ref 0.7–3.1)
Lymphs: 37 %
MCH: 32.1 pg (ref 26.6–33.0)
MCHC: 32.9 g/dL (ref 31.5–35.7)
MCV: 98 fL — ABNORMAL HIGH (ref 79–97)
Monocytes Absolute: 0.6 10*3/uL (ref 0.1–0.9)
Monocytes: 12 %
Neutrophils Absolute: 2.5 10*3/uL (ref 1.4–7.0)
Neutrophils: 49 %
Phosphorus: 2.5 mg/dL — ABNORMAL LOW (ref 2.8–4.1)
Platelets: 258 10*3/uL (ref 150–450)
Potassium: 3.8 mmol/L (ref 3.5–5.2)
RBC: 3.99 x10E6/uL — ABNORMAL LOW (ref 4.14–5.80)
RDW: 12.1 % (ref 11.6–15.4)
Sodium: 138 mmol/L (ref 134–144)
T3 Uptake Ratio: 32 % (ref 24–39)
T4, Total: 8.8 ug/dL (ref 4.5–12.0)
TSH: 0.997 u[IU]/mL (ref 0.450–4.500)
Total Protein: 6.7 g/dL (ref 6.0–8.5)
Triglycerides: 83 mg/dL (ref 0–149)
Uric Acid: 5.6 mg/dL (ref 3.8–8.4)
VLDL Cholesterol Cal: 16 mg/dL (ref 5–40)
WBC: 5 10*3/uL (ref 3.4–10.8)
eGFR: 85 mL/min/{1.73_m2} (ref >59)

## 2023-07-03 LAB — VITAMIN D 25 HYDROXY (VIT D DEFICIENCY, FRACTURES): Vit D, 25-Hydroxy: 22.4 ng/mL — ABNORMAL LOW (ref 30.0–100.0)

## 2023-07-03 MED ORDER — CHOLECALCIFEROL 1.25 MG (50000 UT) PO TABS: 1.0000 | ORAL_TABLET | ORAL | 0 refills | Status: AC

## 2023-07-03 MED ORDER — VITAMIN D3 50 MCG (2000 UT) PO CAPS: 2000.0000 [IU] | ORAL_CAPSULE | Freq: Every day | ORAL | 3 refills | Status: DC

## 2023-07-03 NOTE — Addendum Note (Signed)
Addended by: Albina Billet A on: 07/03/2023 08:41 AM   Modules accepted: Orders, Level of Service

## 2023-07-03 NOTE — Progress Notes (Signed)
Subjective:    Patient ID: Gregory Hamilton, male    DOB: 24-Feb-1966, 57 y.o.   MRN: [id number]  56y/o caucasian male established patient here for lab draw for Be Well 2025  He has reviewed brain shark video and completed tobacco cessation quiz but forgot to bring to clinic today.  His appt rescheduled from 22 Jul when clinic unexpectedly closed and he will not be onsite 25 Jul for rescheduled appt walk in today.  Feeling well denied concerns Patient reported history of vitamin D deficiency 10 years ago took supplement and stopped and hasn't had level retested      Review of Systems  Constitutional:  Negative for chills and fever.  Respiratory:  Negative for cough.   Gastrointestinal:  Negative for diarrhea, nausea and vomiting.       Objective:   Physical Exam Vitals and nursing note reviewed.  Constitutional:      General: He is awake. He is not in acute distress.    Appearance: Normal appearance. He is well-developed, well-groomed and normal weight. He is not ill-appearing, toxic-appearing or diaphoretic.  HENT:     Head: Normocephalic and atraumatic.     Jaw: There is normal jaw occlusion.     Salivary Glands: Right salivary gland is not diffusely enlarged. Left salivary gland is not diffusely enlarged.     Right Ear: Hearing and external ear normal.     Left Ear: Hearing and external ear normal.     Nose: Nose normal. No congestion or rhinorrhea.     Mouth/Throat:     Lips: Pink. No lesions.     Mouth: Mucous membranes are moist.     Pharynx: Oropharynx is clear.  Eyes:     General: Lids are normal. Vision grossly intact. Gaze aligned appropriately. No scleral icterus.       Right eye: No discharge.        Left eye: No discharge.     Extraocular Movements: Extraocular movements intact.     Conjunctiva/sclera: Conjunctivae normal.     Pupils: Pupils are equal, round, and reactive to light.  Neck:     Trachea: Trachea normal.  Pulmonary:     Effort: Pulmonary effort is  normal.     Breath sounds: Normal breath sounds and air entry. No stridor or transmitted upper airway sounds. No wheezing.     Comments: Spoke full sentences without difficulty; no cough observed in exam room Abdominal:     General: Abdomen is flat.  Musculoskeletal:        General: Normal range of motion.     Cervical back: Normal range of motion and neck supple.  Lymphadenopathy:     Head:     Right side of head: No submandibular or preauricular adenopathy.     Left side of head: No submandibular or preauricular adenopathy.     Cervical:     Right cervical: No superficial cervical adenopathy.    Left cervical: No superficial cervical adenopathy.  Skin:    General: Skin is warm and dry.     Capillary Refill: Capillary refill takes less than 2 seconds.     Coloration: Skin is not ashen, cyanotic, jaundiced, mottled, pale or sallow.     Findings: No abrasion, bruising, burn, erythema, signs of injury, laceration, lesion, petechiae, rash or wound.  Neurological:     General: No focal deficit present.     Mental Status: He is alert and oriented to person, place, and time. Mental status is  at baseline.     Cranial Nerves: No cranial nerve deficit.     Motor: Motor function is intact. No weakness, tremor, abnormal muscle tone or seizure activity.     Coordination: Coordination is intact. Coordination normal.     Gait: Gait is intact. Gait normal.     Comments: In/out of chair without difficulty; gait sure and steady in clinic; bilateral hand grasp equal 5/5  Psychiatric:        Attention and Perception: Attention and perception normal.        Mood and Affect: Mood and affect normal.        Speech: Speech normal.        Behavior: Behavior normal. Behavior is cooperative.        Thought Content: Thought content normal.        Cognition and Memory: Cognition and memory normal.        Judgment: Judgment normal.     Lab draw/venipuncture x 1 left AC sent 2 tiger top and lavender tube to  [facility] for executive panel, Hgba1c and vitamin D level.  Gauze and cobain applied notified to remove after 15 minutes avoid heavy lifting next 15 minutes to ensure clot has had time to develop.  Patient stated he is taking break to eat breakfast now.  Patient verbalized understanding information/instructions, agreed with plan of care and and had no further questions at this time.      Assessment & Plan:   A-preventative care  P-Rescheduled RN appt for 07 Jul 829.  Patient to sign Be Well paperwork at that time and review results.  Discussed I will send my chart message once results available.  Patient was fasting this am.  Did not complete Be Well last year due to smoking in previous 12 months.  Patient verbalized understanding information/instructions, agreed with plan of care and and had no further questions at this time.

## 2023-07-03 NOTE — Patient Instructions (Signed)
See RN Bess Kinds Monday to review results/sign paperwork bring tobacco cessation quiz results to her or may see me Thursday in clinic between 11-2

## 2023-07-03 NOTE — Progress Notes (Signed)
My chart message sent to patient  Gregory Hamilton,  Still waiting on a few thyroid hormone results.  Your vitamin D was low again.  I recommend taking 50,000 units once a week for 12 doses with meal then switch to 2000 units daily by mouth with meal.  I have sent 2 prescriptions to your pharmacy on file for both doses.  You can buy the 2000 units over the counter also.  Exitcare handout on vitamin D deficiency sent to my chart.  Recommended to get 15 minutes sunlight on skin daily also.  Your phosphorus slightly low it could be related to low vitamin D.  Exitcare handout on low phosphorus sent to my chart.  Your red blood cells low new (anemia).  Have you changed your diet since last labs?  Any blood in urine/stool could be black or red?  Abdomen pain?  Recently donated blood?  Taking any aspirin/NSAIDS or herbal supplements? Exitcare handout on anemia sent to my chart.  Your iron level was normal but on the low side I would increase your intake of iron rich foods.  See list below.  I saw you had colonoscopy 2021 but can't read report.  I did see you had a negative cologuard in March this year.  Follow up with your PCM  Please let us know if you want copy of labs sent to another provider.  RN Bess Kinds will give you printouts at your appt with her.  I recommend exercise 150 minutes per week; dietary fiber daily by mouth 30 grams men per up to date; eat whole grains/fruits/vegetables; keep added sugars to less than 150 calories/ 7 teaspoons for men per American [employer] Association;  blood sugar spot, 3 month blood sugar level (hgba1c), electrolytes, iron, kidney/thyroid function normal.  No infection on complete blood count  I recommend repeat complete blood count in 3 months and all others fasting in 1 year.  Please let us know if you have further questions or concerns.    Sincerely,    Albina Billet NP-C    It is recommended 8mg  iron in your diet per day for your age/sex.   Breads/cereals and pastas tend to  be fortified with iron.  Meat and beans are most common sources of iron.    Amounts of iron in some foods: Canned clams: 3 ounces (oz) provides 26 milligrams (mg) of iron. Fortified, plain, dry cereal oats: 100 g provides 24.72 mg. White beans: One cup provides 21.09. Dark chocolate (45 to 69 percent cacao): One bar provides 12.99 mg. Cooked Pacific oysters: 3 oz provides 7.82 mg. Cooked spinach: One cup provides 6.43 mg. Beef liver: 3 oz provides 4.17 mg. Boiled and drained lentils: Half a cup provides 3.3 mg. Firm tofu: Half a cup provides 2.03 mg. Boiled and drained chickpeas: Half a cup provides 2.37 mg. Canned, stewed tomatoes: Half a cup provides 1.7 mg. Lean, ground beef: 3 oz provides 2.07 mg. Medium baked potato: This provides 1.87 mg. Roasted cashew nuts: 3 oz provides 2 mg.

## 2023-07-04 ENCOUNTER — Other Ambulatory Visit: Payer: No Typology Code available for payment source | Admitting: Occupational Medicine

## 2023-07-08 ENCOUNTER — Ambulatory Visit: Payer: No Typology Code available for payment source | Admitting: Occupational Medicine

## 2023-07-08 VITALS — BP 129/91 | HR 74 | Ht 72.0 in | Wt 161.0 lb

## 2023-07-08 DIAGNOSIS — Z0000 Encounter for general adult medical examination without abnormal findings: Secondary | ICD-10-CM

## 2023-07-08 NOTE — Progress Notes (Signed)
Be well insurance premium discount evaluation: Met   Patient is smoker completed tobacco cessation alteratives.   Epic reviewed by RN Kimrey transcribed labs and reviewed with the patient. Scheduled follow up labs.   Tobacco attestation signed. [facility] ROI formed signed. Forms placed in the chart.   Patient given handouts for [facility] pharmacies and discount drugs list, MyChart, [facility], [facility], [place] counseling and [employer] counseling.  What to do for infectious illness protocol. Given handout for list of medications that can be filled at [facility]. Given Clinic hours and Clinic Email.

## 2023-07-10 NOTE — Telephone Encounter (Signed)
Provider Be Well 2025 paperwork and alternative paperwork completed 07/09/23 patient smoker Had CSP 2021 and cologuard 2024 iron level normal 22 low RBCs low 3.9.  RN Kimrey notified HR team patient met requirements for insurance discount FY 2025.

## 2023-07-11 ENCOUNTER — Ambulatory Visit: Payer: No Typology Code available for payment source | Admitting: Family Medicine

## 2023-07-11 ENCOUNTER — Encounter: Payer: Self-pay | Admitting: Family Medicine

## 2023-07-11 VITALS — BP 110/74 | HR 81 | Temp 98.1°F | Ht 72.0 in | Wt 166.6 lb

## 2023-07-11 DIAGNOSIS — Z0000 Encounter for general adult medical examination without abnormal findings: Secondary | ICD-10-CM

## 2023-07-11 DIAGNOSIS — Z209 Contact with and (suspected) exposure to unspecified communicable disease: Secondary | ICD-10-CM

## 2023-07-11 DIAGNOSIS — F1010 Alcohol abuse, uncomplicated: Secondary | ICD-10-CM | POA: Diagnosis not present

## 2023-07-11 DIAGNOSIS — N529 Male erectile dysfunction, unspecified: Secondary | ICD-10-CM

## 2023-07-11 DIAGNOSIS — B20 Human immunodeficiency virus [HIV] disease: Secondary | ICD-10-CM | POA: Diagnosis not present

## 2023-07-11 LAB — T-HELPER CELLS (CD4) COUNT (NOT AT ARMC)
Basophils Absolute: 0.1 10*3/uL (ref 0.0–0.2)
Basos: 1 %
EOS (ABSOLUTE): 0.1 10*3/uL (ref 0.0–0.4)
Eos: 1 %
Hematocrit: 40.4 % (ref 37.5–51.0)
Hemoglobin: 13.6 g/dL (ref 13.0–17.7)
Immature Grans (Abs): 0.4 10*3/uL — ABNORMAL HIGH (ref 0.0–0.1)
Immature Granulocytes: 3 %
Lymphocytes Absolute: 2.7 10*3/uL (ref 0.7–3.1)
Lymphs: 20 %
MCH: 32.8 pg (ref 26.6–33.0)
MCHC: 33.7 g/dL (ref 31.5–35.7)
MCV: 97 fL (ref 79–97)
Monocytes Absolute: 0.6 10*3/uL (ref 0.1–0.9)
Monocytes: 4 %
Neutrophils Absolute: 9.7 10*3/uL — ABNORMAL HIGH (ref 1.4–7.0)
Neutrophils: 71 %
Platelets: 321 10*3/uL (ref 150–450)
RBC: 4.15 x10E6/uL (ref 4.14–5.80)
RDW: 12 % (ref 11.6–15.4)
WBC: 13.6 10*3/uL — ABNORMAL HIGH (ref 3.4–10.8)

## 2023-07-11 MED ORDER — DOXYCYCLINE HYCLATE 100 MG PO TABS
ORAL_TABLET | ORAL | 2 refills | Status: DC
Start: 2023-07-11 — End: 2024-03-02

## 2023-07-11 MED ORDER — SILDENAFIL CITRATE 100 MG PO TABS: 100.0000 mg | ORAL_TABLET | Freq: Every day | ORAL | 5 refills | Status: DC | PRN

## 2023-07-11 NOTE — Progress Notes (Signed)
Complete physical exam  Patient: Gregory Hamilton   DOB: 1966/06/11   57 y.o. Male  MRN: [id number]  Subjective:    Chief Complaint  Patient presents with   Annual Exam    Gregory Hamilton is a 57 y.o. male who presents today for a complete physical exam. He reports consuming a  cut back on meat but otherwise general  diet. Home exercise routine includes walking 1 hrs per day. He generally feels well. He reports sleeping well. He does not have additional problems to discuss today.  He does not smoke but is vaping and is considering quitting.  He did have recent blood work done which did show slightly low vitamin D.  He is now taking a multivitamin.  He has had some difficulty with erectile dysfunction and would like to get Viagra.  He has been buying it over the Internet.  He continues on Genvoya with no trouble.  His drinking habit is now only on the weekends and he will go through a bottle of tequila per weekend.  Does occasionally smoke marijuana.  He continues on Genvoya and has been stable on this for quite some time.  He is sexually active with several people and uses doxycycline for prevention.   Most recent fall risk assessment:    07/11/2023   10:29 AM  Fall Risk   Falls in the past year? 0  Number falls in past yr: 0  Injury with Fall? 0  Risk for fall due to : No Fall Risks  Follow up Falls evaluation completed     Most recent depression screenings:    03/20/2022    7:58 AM 03/09/2021    2:29 PM  PHQ 2/9 Scores  PHQ - 2 Score 0 0    Vision:Not within last year  and Dental: No current dental problems and Last dental visit: 3 months ago    Patient Care Team: Ronnald Nian, MD as PCP - General (Family Medicine)   Outpatient Medications Prior to Visit  Medication Sig   cetirizine (ZYRTEC) 10 MG tablet Take 10 mg by mouth daily.   Cholecalciferol 1.25 MG (50000 UT) TABS Take 1 tablet by mouth once a week for 12 doses. Take with meal    elvitegravir-cobicistat-emtricitabine-tenofovir (GENVOYA) 150-150-200-10 MG TABS tablet TAKE 1 TABLET DAILY   [DISCONTINUED] doxycycline (VIBRA-TABS) 100 MG tablet TAKE 2 TABLET BY MOUTH WITH SEXUAL ACTIVITY   [START ON 10/03/2023] Cholecalciferol (VITAMIN D3) 50 MCG (2000 UT) capsule Take 1 capsule (2,000 Units total) by mouth daily. (Patient not taking: Reported on 07/11/2023)   No facility-administered medications prior to visit.    Review of Systems  All other systems reviewed and are negative.         Objective:     BP 110/74   Pulse 81   Temp 98.1 F (36.7 C)   Ht 6' (1.829 m)   Wt 166 lb 9.6 oz (75.6 kg)   SpO2 96%   BMI 22.60 kg/m    Physical Exam  Alert and in no distress.  Funduscopic exam negative.  Tympanic membranes and canals are normal. Pharyngeal area is normal. Neck is supple without adenopathy or thyromegaly. Cardiac exam shows a regular sinus rhythm without murmurs or gallops. Lungs are clear to auscultation. Exam shows no masses or tenderness.  Throat rectal and cultures taken.     Assessment & Plan:    Routine general medical examination at a health care facility  Human immunodeficiency virus (HIV) disease (  HCC) - Plan: HIV-1 RNA quant-no reflex-bld, T-helper cells (CD4) count (not at [facility]), CANCELED: T-helper cells (CD4) count (not at [facility])  Contact with or exposure to communicable disease - Plan: doxycycline (VIBRA-TABS) 100 MG tablet, CT/NG RNA, TMA Rectal, Ct/GC NAA, Pharyngeal, GC/Chlamydia Probe Amp([facility])  Erectile dysfunction, unspecified erectile dysfunction type - Plan: sildenafil (VIAGRA) 100 MG tablet  Alcohol consumption binge drinking  Immunization History  Administered Date(s) Administered   Hepatitis B, ADULT 06/22/2004   Influenza,inj,Quad PF,6+ Mos 10/05/2016, 09/27/2017, 08/24/2019, 09/28/2021, 09/14/2022   Influenza,inj,quad, With Preservative 09/09/2013   Influenza,trivalent, recombinat, inj, PF 10/05/2004, 09/09/2012    Influenza-Unspecified 09/09/2020   Meningococcal Conjugate 04/17/2017, 10/23/2017   Moderna Covid-19 Vaccine Bivalent Booster 65yrs & up 09/28/2021   Moderna Sars-Covid-2 Vaccination 02/19/2020, 03/18/2020, 10/27/2020   PPD Test 01/15/2005, 08/20/2012   Pneumococcal Conjugate-13 08/20/2012   Pneumococcal Polysaccharide-23 12/24/2003, 03/04/2013   Tdap 04/01/2019   Zoster Recombinant(Shingrix) 11/15/2022, 01/17/2023    Health Maintenance  Topic Date Due   COVID-19 Vaccine (5 - 2023-24 season) 08/10/2022   INFLUENZA VACCINE  07/11/2023   Fecal DNA (Cologuard)  03/05/2026   DTaP/Tdap/Td (2 - Td or Tdap) 03/31/2029   Hepatitis C Screening  Completed   HIV Screening  Completed   Zoster Vaccines- Shingrix  Completed   HPV VACCINES  Aged Out   Colonoscopy  Discontinued     Problem List Items Addressed This Visit     Human immunodeficiency virus (HIV) disease (HCC)   Relevant Orders   HIV-1 RNA quant-no reflex-bld   T-helper cells (CD4) count (not at [facility])   Other Visit Diagnoses     Routine general medical examination at a health care facility    -  Primary   Contact with or exposure to communicable disease       Relevant Medications   doxycycline (VIBRA-TABS) 100 MG tablet   Other Relevant Orders   CT/NG RNA, TMA Rectal   Ct/GC NAA, Pharyngeal   GC/Chlamydia Probe Amp([facility])   Erectile dysfunction, unspecified erectile dysfunction type       Relevant Medications   sildenafil (VIAGRA) 100 MG tablet   Alcohol consumption binge drinking         Discussed alcohol consumption with him and encouraged to decrease this.  I will work with him when he is ready to quit vaping.  Also discussed possibly stopping Genvoya and switching to another antiviral regimen that would be potentially less side effects.  He has 3 months left on his present dosing I will wait for that.    Sharlot Gowda, MD

## 2023-07-16 LAB — CT/GC NAA, PHARYNGEAL
C TRACH RRNA NPH QL PCR: NEGATIVE
N GONORRHOEA RRNA NPH QL PCR: NEGATIVE

## 2023-09-10 ENCOUNTER — Telehealth: Payer: Self-pay | Admitting: Family Medicine

## 2023-09-10 DIAGNOSIS — B20 Human immunodeficiency virus [HIV] disease: Secondary | ICD-10-CM

## 2023-09-10 MED ORDER — GENVOYA 150-150-200-10 MG PO TABS
1.0000 | ORAL_TABLET | Freq: Every day | ORAL | 3 refills | Status: DC
Start: 2023-09-10 — End: 2024-07-23

## 2023-09-10 NOTE — Telephone Encounter (Signed)
Fax from [employer] 150/150/200/10

## 2023-10-29 ENCOUNTER — Telehealth: Payer: Self-pay | Admitting: Registered Nurse

## 2023-10-29 ENCOUNTER — Encounter: Payer: Self-pay | Admitting: Registered Nurse

## 2023-10-29 NOTE — Telephone Encounter (Signed)
Mild anemia on be well 2025 labs left message for patient to schedule with [facility] or PCM recommended.  Orders expired extended today 1 month in epic.

## 2023-12-09 ENCOUNTER — Other Ambulatory Visit: Payer: Self-pay | Admitting: Registered Nurse

## 2023-12-09 ENCOUNTER — Other Ambulatory Visit: Payer: No Typology Code available for payment source

## 2023-12-09 NOTE — Progress Notes (Signed)
EE in clinic this morning for a non-fasting CBC with diff requested by Albina Billet, NP as a follow-up for anemia.  Blood drawn right antecubital and procedure tolerated well by EE.  Reece Packer, RN, BSN, MPH, COHN-S

## 2023-12-10 ENCOUNTER — Encounter: Payer: Self-pay | Admitting: Registered Nurse

## 2023-12-10 ENCOUNTER — Ambulatory Visit: Payer: No Typology Code available for payment source | Admitting: Registered Nurse

## 2023-12-10 DIAGNOSIS — Z23 Encounter for immunization: Secondary | ICD-10-CM

## 2023-12-10 LAB — CBC WITH DIFFERENTIAL/PLATELET
Basophils Absolute: 0 10*3/uL (ref 0.0–0.2)
Basos: 0 %
EOS (ABSOLUTE): 0 10*3/uL (ref 0.0–0.4)
Eos: 0 %
Hematocrit: 45.7 % (ref 37.5–51.0)
Hemoglobin: 15.6 g/dL (ref 13.0–17.7)
Immature Grans (Abs): 0 10*3/uL (ref 0.0–0.1)
Immature Granulocytes: 0 %
Lymphocytes Absolute: 1.7 10*3/uL (ref 0.7–3.1)
Lymphs: 24 %
MCH: 32.5 pg (ref 26.6–33.0)
MCHC: 34.1 g/dL (ref 31.5–35.7)
MCV: 95 fL (ref 79–97)
Monocytes Absolute: 0.4 10*3/uL (ref 0.1–0.9)
Monocytes: 6 %
Neutrophils Absolute: 5 10*3/uL (ref 1.4–7.0)
Neutrophils: 70 %
Platelets: 254 10*3/uL (ref 150–450)
RBC: 4.8 x10E6/uL (ref 4.14–5.80)
RDW: 11.8 % (ref 11.6–15.4)
WBC: 7.2 10*3/uL (ref 3.4–10.8)

## 2023-12-10 NOTE — Progress Notes (Signed)
 Subjective:    Patient ID: Gregory Hamilton, male    DOB: [birth year], 57 y.o.   MRN: [id number]  57y/o established caucasian male requested influenza vaccine has not received this fall booster  Denied current illness/fever or previous serious reaction to flu vaccine      Review of Systems  Constitutional:  Negative for fever.  HENT:  Negative for rhinorrhea.   Respiratory:  Negative for cough.   Gastrointestinal:  Negative for diarrhea and vomiting.  Skin:  Negative for rash.  Hematological:  Negative for adenopathy.       Objective:   Physical Exam Vitals and nursing note reviewed.  Constitutional:      General: He is awake. He is not in acute distress.    Appearance: Normal appearance. He is well-developed, well-groomed and normal weight. He is not ill-appearing, toxic-appearing or diaphoretic.  HENT:     Head: Normocephalic and atraumatic.     Jaw: There is normal jaw occlusion.     Salivary Glands: Right salivary gland is not diffusely enlarged. Left salivary gland is not diffusely enlarged.     Right Ear: Hearing and external ear normal.     Left Ear: Hearing and external ear normal.     Nose: Nose normal. No congestion or rhinorrhea.     Mouth/Throat:     Lips: Pink. No lesions.     Mouth: Mucous membranes are moist.     Pharynx: Oropharynx is clear.  Eyes:     General: Lids are normal. Vision grossly intact. Gaze aligned appropriately. No scleral icterus.       Right eye: No discharge.        Left eye: No discharge.     Extraocular Movements: Extraocular movements intact.     Conjunctiva/sclera: Conjunctivae normal.     Pupils: Pupils are equal, round, and reactive to light.  Neck:     Trachea: Trachea normal.  Cardiovascular:     Rate and Rhythm: Normal rate and regular rhythm.  Pulmonary:     Effort: Pulmonary effort is normal. No respiratory distress.     Breath sounds: Normal breath sounds and air entry. No stridor or transmitted upper airway sounds. No  wheezing.     Comments: Spoke full sentences without difficulty; no cough observed in exam room Abdominal:     General: Abdomen is flat.     Palpations: Abdomen is soft.  Musculoskeletal:        General: Normal range of motion.     Cervical back: Normal range of motion and neck supple. No rigidity.     Right lower leg: No edema.     Left lower leg: No edema.  Lymphadenopathy:     Head:     Right side of head: No submandibular or preauricular adenopathy.     Left side of head: No submandibular or preauricular adenopathy.     Cervical: No cervical adenopathy.     Right cervical: No superficial cervical adenopathy.    Left cervical: No superficial cervical adenopathy.  Skin:    General: Skin is warm and dry.     Capillary Refill: Capillary refill takes less than 2 seconds.     Coloration: Skin is not ashen, cyanotic, jaundiced, mottled, pale or sallow.     Findings: No abrasion, bruising, burn, erythema, signs of injury, laceration, lesion, petechiae, rash or wound.  Neurological:     General: No focal deficit present.     Mental Status: He is alert and oriented to  person, place, and time. Mental status is at baseline.     Cranial Nerves: No cranial nerve deficit.     Motor: Motor function is intact. No weakness, tremor, abnormal muscle tone or seizure activity.     Coordination: Coordination is intact. Coordination normal.     Gait: Gait is intact. Gait normal.     Comments: In/out of chair without difficulty; gait sure and steady in clinic; bilateral hand grasp equal 5/5  Psychiatric:        Attention and Perception: Attention and perception normal.        Mood and Affect: Mood and affect normal.        Speech: Speech normal.        Behavior: Behavior normal. Behavior is cooperative.        Thought Content: Thought content normal.        Cognition and Memory: Cognition and memory normal.        Judgment: Judgment normal.      Normal CBC discussed results in clinic see results  note     Assessment & Plan:   A-need for influenza vaccine  P-administered 0.47ml x 1 left deltoid and applied bandaid clean dry and intact on ambulatory discharge respirations even and unlabored RA  given VIS influenza and exitcare copy; see [place] immunization patient questionnaire at [facility] paper copy.  Patient had no further questions or concerns at this time and verbalized understanding information/instructions.

## 2023-12-10 NOTE — Patient Instructions (Signed)
Influenza (Flu) Vaccine (Inactivated or Recombinant): What You Need to Know Many vaccine information statements are available in Spanish and other languages. See PromoAge.com.br. 1. Why get vaccinated? Influenza vaccine can prevent influenza (flu). Flu is a contagious disease that spreads around the [place] every year, usually between October and May. Anyone can get the flu, but it is more dangerous for some people. Infants and young children, people 58 years and older, pregnant people, and people with certain health conditions or a weakened immune system are at greatest risk of flu complications. Pneumonia, bronchitis, sinus infections, and ear infections are examples of flu-related complications. If you have a medical condition, such as heart disease, cancer, or diabetes, flu can make it worse. Flu can cause fever and chills, sore throat, muscle aches, fatigue, cough, headache, and runny or stuffy nose. Some people may have vomiting and diarrhea, though this is more common in children than adults. In an average year, thousands of people in the [place] die from flu, and many more are hospitalized. Flu vaccine prevents millions of illnesses and flu-related visits to the doctor each year. 2. Influenza vaccines CDC recommends everyone 6 months and older get vaccinated every flu season. Children 6 months through 51 years of age may need 2 doses during a single flu season. Everyone else needs only 1 dose each flu season. It takes about 2 weeks for protection to develop after vaccination. There are many flu viruses, and they are always changing. Each year a new flu vaccine is made to protect against the influenza viruses believed to be likely to cause disease in the upcoming flu season. Even when the vaccine doesn't exactly match these viruses, it may still provide some protection. Influenza vaccine does not cause flu. Influenza vaccine may be given at the same time as other vaccines. 3.  Talk with your health care provider Tell your vaccination provider if the person getting the vaccine: Has had an allergic reaction after a previous dose of influenza vaccine, or has any severe, life-threatening allergies Has ever had Guillain-Barr Syndrome (also called "GBS") In some cases, your health care provider may decide to postpone influenza vaccination until a future visit. Influenza vaccine can be administered at any time during pregnancy. People who are or will be pregnant during influenza season should receive inactivated influenza vaccine. People with minor illnesses, such as a cold, may be vaccinated. People who are moderately or severely ill should usually wait until they recover before getting influenza vaccine. Your health care provider can give you more information. 4. Risks of a vaccine reaction Soreness, redness, and swelling where the shot is given, fever, muscle aches, and headache can happen after influenza vaccination. There may be a very small increased risk of Guillain-Barr Syndrome (GBS) after inactivated influenza vaccine (the flu shot). Young children who get the flu shot along with pneumococcal vaccine (PCV13) and/or DTaP vaccine at the same time might be slightly more likely to have a seizure caused by fever. Tell your health care provider if a child who is getting flu vaccine has ever had a seizure. People sometimes faint after medical procedures, including vaccination. Tell your provider if you feel dizzy or have vision changes or ringing in the ears. As with any medicine, there is a very remote chance of a vaccine causing a severe allergic reaction, other serious injury, or death. 5. What if there is a serious problem? An allergic reaction could occur after the vaccinated person leaves the clinic. If you see signs of  a severe allergic reaction (hives, swelling of the face and throat, difficulty breathing, a fast heartbeat, dizziness, or weakness), call 9-1-1 and get  the person to the nearest hospital. For other signs that concern you, call your health care provider. Adverse reactions should be reported to the Vaccine Adverse Event Reporting System (VAERS). Your health care provider will usually file this report, or you can do it yourself. Visit the VAERS website at www.vaers.LAgents.no or call (774)365-3656. VAERS is only for reporting reactions, and VAERS staff members do not give medical advice. 6. The National Vaccine Injury Compensation Program The [employer] Vaccine Injury Compensation Program (VICP) is a federal program that was created to compensate people who may have been injured by certain vaccines. Claims regarding alleged injury or death due to vaccination have a time limit for filing, which may be as short as two years. Visit the VICP website at SpiritualWord.at or call 3187036210 to learn about the program and about filing a claim. 7. How can I learn more? Ask your health care provider. Call your local or state health department. Visit the website of the Food and Drug Administration (FDA) for vaccine package inserts and additional information at FinderList.no. Contact the Centers for Disease Control and Prevention (CDC): Call 787-694-5930 (1-800-CDC-INFO) or Visit CDC's website at BiotechRoom.com.cy. Source: CDC Vaccine Information Statement Inactivated Influenza Vaccine (07/15/2020) This same material is available at FootballExhibition.com.br for no charge. This information is not intended to replace advice given to you by your health care provider. Make sure you discuss any questions you have with your health care provider. Document Revised: 03/13/2023 Document Reviewed: 12/17/2022 Elsevier Patient Education  2024 [employer].

## 2024-01-07 ENCOUNTER — Encounter: Payer: Self-pay | Admitting: Family Medicine

## 2024-01-07 ENCOUNTER — Ambulatory Visit: Payer: No Typology Code available for payment source | Admitting: Family Medicine

## 2024-01-07 VITALS — BP 130/86 | HR 83 | Ht 72.0 in | Wt 170.2 lb

## 2024-01-07 DIAGNOSIS — R319 Hematuria, unspecified: Secondary | ICD-10-CM | POA: Diagnosis not present

## 2024-01-07 DIAGNOSIS — Z209 Contact with and (suspected) exposure to unspecified communicable disease: Secondary | ICD-10-CM

## 2024-01-07 LAB — CBC WITH DIFFERENTIAL/PLATELET
Basophils Absolute: 0.1 10*3/uL (ref 0.0–0.2)
Basos: 1 %
EOS (ABSOLUTE): 0 10*3/uL (ref 0.0–0.4)
Eos: 0 %
Hematocrit: 43.9 % (ref 37.5–51.0)
Hemoglobin: 14.7 g/dL (ref 13.0–17.7)
Immature Grans (Abs): 0 10*3/uL (ref 0.0–0.1)
Immature Granulocytes: 0 %
Lymphocytes Absolute: 2.5 10*3/uL (ref 0.7–3.1)
Lymphs: 23 %
MCH: 32.4 pg (ref 26.6–33.0)
MCHC: 33.5 g/dL (ref 31.5–35.7)
MCV: 97 fL (ref 79–97)
Monocytes Absolute: 0.6 10*3/uL (ref 0.1–0.9)
Monocytes: 5 %
Neutrophils Absolute: 7.9 10*3/uL — ABNORMAL HIGH (ref 1.4–7.0)
Neutrophils: 71 %
Platelets: 281 10*3/uL (ref 150–450)
RBC: 4.54 x10E6/uL (ref 4.14–5.80)
RDW: 12.7 % (ref 11.6–15.4)
WBC: 11.2 10*3/uL — ABNORMAL HIGH (ref 3.4–10.8)

## 2024-01-07 LAB — POCT URINALYSIS DIP (PROADVANTAGE DEVICE)
Bilirubin, UA: NEGATIVE
Blood, UA: NEGATIVE
Glucose, UA: NEGATIVE mg/dL
Ketones, POC UA: NEGATIVE mg/dL
Leukocytes, UA: NEGATIVE
Nitrite, UA: NEGATIVE
Protein Ur, POC: NEGATIVE mg/dL
Specific Gravity, Urine: 1.02
Urobilinogen, Ur: 0.2
pH, UA: 6 (ref 5.0–8.0)

## 2024-01-07 NOTE — Progress Notes (Signed)
   Subjective:    Patient ID: Gregory Hamilton, male    DOB: Jul 29, 1966, 58 y.o.   MRN: [id number]  HPI He is here for consult concerning seeing blood in his urine.  He describes pinkish urine and then turned red with clots.  This lasted 1 day and then went away.  He has had no abdominal pain other than a vague right lower quadrant pain that was transient in nature.  No nausea, vomiting, urinary symptoms.  He is sexually active but has been in a stable relationship for the last 3 or 4 months.  He would still like to be STD tested to be safe.   Review of Systems     Objective:    Physical Exam Alert and in no distress.  Abdominal and genital exam normal.  Rectal exam shows a normal prostate.  Urine dipstick as well as microscopic was negative.       Assessment & Plan:  Hematuria, unspecified type - Plan: POCT Urinalysis DIP (Proadvantage Device), CT RENAL ABD W/WO, CBC with Differential/Platelet  Contact with or exposure to communicable disease - Plan: GC/Chlamydia Probe Amp I explained that under the circumstances of seeing gross hematuria, we will need to pursue this further including referring to urology but I will wait for the CT scan to come back.

## 2024-01-08 ENCOUNTER — Encounter: Payer: Self-pay | Admitting: Family Medicine

## 2024-01-10 ENCOUNTER — Other Ambulatory Visit: Payer: Self-pay | Admitting: Medical

## 2024-01-10 ENCOUNTER — Other Ambulatory Visit: Payer: Self-pay

## 2024-01-10 DIAGNOSIS — R319 Hematuria, unspecified: Secondary | ICD-10-CM

## 2024-01-10 LAB — GC/CHLAMYDIA PROBE AMP
Chlamydia trachomatis, NAA: NEGATIVE
Neisseria Gonorrhoeae by PCR: NEGATIVE

## 2024-01-13 ENCOUNTER — Other Ambulatory Visit: Payer: Self-pay

## 2024-01-13 DIAGNOSIS — R319 Hematuria, unspecified: Secondary | ICD-10-CM

## 2024-01-14 ENCOUNTER — Other Ambulatory Visit: Payer: Self-pay | Admitting: Medical

## 2024-01-14 DIAGNOSIS — R319 Hematuria, unspecified: Secondary | ICD-10-CM

## 2024-01-17 NOTE — Telephone Encounter (Signed)
 Patient had labs drawn 12/09/23 see results note

## 2024-01-20 ENCOUNTER — Ambulatory Visit
Admission: RE | Admit: 2024-01-20 | Discharge: 2024-01-20 | Disposition: A | Discharge status: Home / Self Care | Payer: No Typology Code available for payment source | Source: Ambulatory Visit | Attending: Medical | Admitting: Medical

## 2024-01-20 DIAGNOSIS — R319 Hematuria, unspecified: Secondary | ICD-10-CM

## 2024-01-20 MED ORDER — IOPAMIDOL (ISOVUE-300) INJECTION 61%
125.0000 mL | Freq: Once | INTRAVENOUS | Status: AC | PRN
  Administered 2024-01-20: 125 mL via INTRAVENOUS

## 2024-01-22 ENCOUNTER — Ambulatory Visit (INDEPENDENT_AMBULATORY_CARE_PROVIDER_SITE_OTHER): Payer: No Typology Code available for payment source | Admitting: Family Medicine

## 2024-01-22 ENCOUNTER — Encounter: Payer: Self-pay | Admitting: Family Medicine

## 2024-01-22 VITALS — BP 126/78 | HR 68 | Temp 98.1°F | Wt 176.0 lb

## 2024-01-22 DIAGNOSIS — R319 Hematuria, unspecified: Secondary | ICD-10-CM

## 2024-01-22 DIAGNOSIS — N6342 Unspecified lump in left breast, subareolar: Secondary | ICD-10-CM

## 2024-01-22 NOTE — Progress Notes (Signed)
   Subjective:    Patient ID: Gregory Hamilton, male    DOB: 20-Jul-1966, 58 y.o.   MRN: [id number]  HPI He is here for follow-up on his hematuria.  He has not had any more episodes of blood in his urine.  He does have a 1 week history of difficulty with left breast tenderness.  No discharge or injury to it.  He does have a previous history of sounds like gynecomastia that occurred when he was in his 73s.  He did get it evaluated and they found no particular issues.  He then also had a biopsy on the left breast when he was in his 54s again which was negative.  He does smoke marijuana and does drink alcohol but has not changed these habits in quite some time. CT scan was done which showed no major urologic issues.  Review of Systems     Objective:    Physical Exam Exam of his breasts shows no palpable lesions on the right however a somewhat soft and palpable 1-1/2 cm lesion in the left breast.  Abdominal exam was negative.       Assessment & Plan:  Subareolar mass of left breast - Plan: MM Digital Diagnostic Unilat L  Hematuria, unspecified type - Plan: Ambulatory referral to Urology I suspect that the breast mass could be related to alcohol and marijuana use.  Will see what the mammogram shows and pursue it further if needed. The CT scan was negative but I explained that I think he is going to need to see urology and possibly get a cystoscopy to make sure that there is no bladder issues.

## 2024-01-23 ENCOUNTER — Other Ambulatory Visit: Payer: Self-pay | Admitting: Family Medicine

## 2024-01-23 DIAGNOSIS — N6342 Unspecified lump in left breast, subareolar: Secondary | ICD-10-CM

## 2024-01-24 ENCOUNTER — Other Ambulatory Visit: Payer: Self-pay | Admitting: Medical

## 2024-01-24 DIAGNOSIS — R319 Hematuria, unspecified: Secondary | ICD-10-CM

## 2024-01-27 ENCOUNTER — Other Ambulatory Visit: Payer: Self-pay | Admitting: Family Medicine

## 2024-01-27 DIAGNOSIS — R319 Hematuria, unspecified: Secondary | ICD-10-CM

## 2024-02-04 ENCOUNTER — Encounter: Payer: Self-pay | Admitting: Internal Medicine

## 2024-02-06 ENCOUNTER — Ambulatory Visit
Admission: RE | Admit: 2024-02-06 | Discharge: 2024-02-06 | Disposition: A | Discharge status: Home / Self Care | Payer: No Typology Code available for payment source | Source: Ambulatory Visit | Attending: Family Medicine | Admitting: Family Medicine

## 2024-02-06 ENCOUNTER — Ambulatory Visit: Payer: Managed Care, Other (non HMO)

## 2024-02-07 ENCOUNTER — Encounter: Payer: Self-pay | Admitting: Family Medicine

## 2024-02-13 ENCOUNTER — Ambulatory Visit: Payer: Managed Care, Other (non HMO) | Admitting: Urology

## 2024-02-13 ENCOUNTER — Encounter: Payer: Self-pay | Admitting: Urology

## 2024-02-13 VITALS — BP 146/84 | HR 80 | Ht 72.0 in | Wt 165.0 lb

## 2024-02-13 DIAGNOSIS — R310 Gross hematuria: Secondary | ICD-10-CM | POA: Diagnosis not present

## 2024-02-13 LAB — URINALYSIS, ROUTINE W REFLEX MICROSCOPIC
Bilirubin, UA: NEGATIVE
Glucose, UA: NEGATIVE
Ketones, UA: NEGATIVE
Leukocytes,UA: NEGATIVE
Nitrite, UA: NEGATIVE
Protein,UA: NEGATIVE
RBC, UA: NEGATIVE
Specific Gravity, UA: 1.025 (ref 1.005–1.030)
Urobilinogen, Ur: 0.2 mg/dL (ref 0.2–1.0)
pH, UA: 5.5 (ref 5.0–7.5)

## 2024-02-13 MED ORDER — CIPROFLOXACIN HCL 500 MG PO TABS
500.0000 mg | ORAL_TABLET | Freq: Once | ORAL | Status: AC
  Administered 2024-02-13: 500 mg via ORAL

## 2024-02-13 NOTE — Progress Notes (Signed)
 Assessment: 1. Gross hematuria     Plan: I personally reviewed the patient's chart including provider notes, lab and imaging results. Today I had a discussion with the patient regarding the findings of gross hematuria including the implications and differential diagnoses associated with it.  I also discussed recommendations for further evaluation including the rationale for upper tract imaging and cystoscopy.  I discussed the nature of these procedures including potential risk and complications.  The patient expressed an understanding of these issues. I personally reviewed the CT study from 2/25 with results as noted below. The cystoscopy evaluation was negative today. Urine cytology sent. Cipro x 1 following cystoscopy. Return to office in 6-8 weeks.  Chief Complaint:  Chief Complaint  Patient presents with   Hematuria    History of Present Illness:  Gregory Hamilton is a 58 y.o. male who is seen in consultation from Ronnald Nian, MD for evaluation of gross hematuria. He had an episode of painless gross hematuria approximately 1 month ago.  He initially noted pink-tinged urine which became more bloody and he eventually passed some clots.  His urine cleared after 24 hours.  He has had no further episodes of gross hematuria.  No frequency, urgency, dysuria, or flank pain associated with hematuria. He was seen by Dr. Susann Givens in January 2025 following this episode.  Urinalysis showed no evidence of blood.   CT abdomen pelvis with and without contrast on 01/22/2024 showed bilateral renal cyst, no renal or ureteral calculi, no evidence of obstruction or filling defect.  No history of UTIs or kidney stones.  He does have a history of tobacco use smoking 1 pack/day for approximately 30 years.  He quit 5 years ago.  He currently is not having any significant lower urinary tract symptoms other than nocturia x 1.  Past Medical History:  Past Medical History:  Diagnosis Date   HIV  infection (HCC)     Past Surgical History:  Past Surgical History:  Procedure Laterality Date   GYNECOMASTIA EXCISION     ROTATOR CUFF REPAIR      Allergies:  Allergies  Allergen Reactions   Codeine Nausea And Vomiting and Other (See Comments)   Penicillins Hives    Family History:  History reviewed. No pertinent family history.  Social History:  Social History   Tobacco Use   Smoking status: Former    Current packs/day: 0.00    Types: Cigarettes    Quit date: 12/24/2020    Years since quitting: 3.1   Smokeless tobacco: Never  Substance Use Topics   Alcohol use: Yes    Alcohol/week: 1.0 - 2.0 standard drink of alcohol    Types: 1 - 2 Glasses of wine per week   Drug use: Never    Review of symptoms:  Constitutional:  Negative for unexplained weight loss, night sweats, fever, chills ENT:  Negative for nose bleeds, sinus pain, painful swallowing CV:  Negative for chest pain, shortness of breath, exercise intolerance, palpitations, loss of consciousness Resp:  Negative for cough, wheezing, shortness of breath GI:  Negative for nausea, vomiting, diarrhea, bloody stools GU:  Positives noted in HPI; otherwise negative for dysuria, urinary incontinence Neuro:  Negative for seizures, poor balance, limb weakness, slurred speech Psych:  Negative for lack of energy, depression, anxiety Endocrine:  Negative for polydipsia, polyuria, symptoms of hypoglycemia (dizziness, hunger, sweating) Hematologic:  Negative for anemia, purpura, petechia, prolonged or excessive bleeding, use of anticoagulants  Allergic:  Negative for difficulty breathing or choking  as a result of exposure to anything; no shellfish allergy; no allergic response (rash/itch) to materials, foods  Physical exam: BP (!) 146/84   Pulse 80   Ht 6' (1.829 m)   Wt 165 lb (74.8 kg)   BMI 22.38 kg/m  GENERAL APPEARANCE:  Well appearing, well developed, well nourished, NAD HEENT: Atraumatic, Normocephalic, oropharynx  clear. NECK: Supple without lymphadenopathy or thyromegaly. LUNGS: Clear to auscultation bilaterally. HEART: Regular Rate and Rhythm without murmurs, gallops, or rubs. ABDOMEN: Soft, non-tender, No Masses. EXTREMITIES: Moves all extremities well.  Without clubbing, cyanosis, or edema. NEUROLOGIC:  Alert and oriented x 3, normal gait, CN II-XII grossly intact.  MENTAL STATUS:  Appropriate. BACK:  Non-tender to palpation.  No CVAT SKIN:  Warm, dry and intact.    Results: U/A: negative  Procedure:  Flexible Cystourethroscopy  Pre-operative Diagnosis: Gross hematuria  Post-operative Diagnosis: Gross hematuria  Anesthesia:  local with lidocaine jelly  Surgical Narrative:  After appropriate informed consent was obtained, the patient was prepped and draped in the usual sterile fashion in the supine position.  The patient was correctly identified and the proper procedure delineated prior to proceeding.  Sterile lidocaine gel was instilled in the urethra. The flexible cystoscope was introduced without difficulty.  Findings:  Anterior urethra: Normal  Posterior urethra:  mild lateral lobe enlargement  Bladder: Normal  Ureteral orifices: normal  Additional findings: none  Saline bladder wash for cytology was performed.    The cystoscope was then removed.  The patient tolerated the procedure well.

## 2024-02-13 NOTE — Addendum Note (Signed)
 Addended by: Lizbeth Bark on: 02/13/2024 03:32 PM   Modules accepted: Orders

## 2024-02-15 ENCOUNTER — Telehealth: Payer: Self-pay | Admitting: Registered Nurse

## 2024-02-15 ENCOUNTER — Encounter: Payer: Self-pay | Admitting: Registered Nurse

## 2024-02-15 DIAGNOSIS — Z0000 Encounter for general adult medical examination without abnormal findings: Secondary | ICD-10-CM

## 2024-02-15 NOTE — Telephone Encounter (Signed)
 Received notice from Epic CBC not drawn/order expired.  Reviewed Epic and patient completed with [facility] Susann Givens 01/07/24.  Be Well labs/appt with RN due Jul 2025 executive panel with Hgba1c fasting entered for patient.  Message left with patient to schedule lab appt with RN Olegario Messier for fasting labs prior to 08 Jul 2024.

## 2024-02-19 ENCOUNTER — Encounter: Payer: Self-pay | Admitting: Urology

## 2024-02-24 ENCOUNTER — Encounter: Payer: Self-pay | Admitting: Urology

## 2024-03-01 ENCOUNTER — Other Ambulatory Visit: Payer: Self-pay | Admitting: Family Medicine

## 2024-03-01 DIAGNOSIS — Z209 Contact with and (suspected) exposure to unspecified communicable disease: Secondary | ICD-10-CM

## 2024-03-02 NOTE — Telephone Encounter (Signed)
 Last apt 07/11/23.

## 2024-03-24 ENCOUNTER — Ambulatory Visit: Admitting: Urology

## 2024-04-30 ENCOUNTER — Encounter: Payer: Self-pay | Admitting: Registered Nurse

## 2024-04-30 ENCOUNTER — Telehealth: Payer: Self-pay | Admitting: Registered Nurse

## 2024-04-30 DIAGNOSIS — S30860A Insect bite (nonvenomous) of lower back and pelvis, initial encounter: Secondary | ICD-10-CM

## 2024-04-30 MED ORDER — DOXYCYCLINE HYCLATE 100 MG PO TABS: 100.0000 mg | ORAL_TABLET | Freq: Two times a day (BID) | ORAL | Status: AC

## 2024-04-30 MED ORDER — TRIPLE ANTIBIOTIC 3.5-400-5000 EX OINT: 1.0000 | TOPICAL_OINTMENT | Freq: Two times a day (BID) | CUTANEOUS | Status: AC

## 2024-04-30 NOTE — Telephone Encounter (Signed)
 Patient reported one month ago tick removed but head embedded left scapular area/lateral axilla partner had to help him remove head as could not see well with bifocals due to affected area even using mirror.  Still has lump at area.  Seen in clinic punctate noted center papule dispensed doxycycline  100mg  po BID x !0 days #20 RF0 tick bite hand out discussed do not scratch area.  Denied arthralgia, rash bullseye, joint aches, fatigue, memory fog or headaches  Follow up if does not resolve or new symptoms develop.  Patient agreed with plan of care and had no further questions at this time.

## 2024-06-16 ENCOUNTER — Other Ambulatory Visit: Payer: Self-pay | Admitting: Registered Nurse

## 2024-06-16 ENCOUNTER — Other Ambulatory Visit: Admitting: Registered Nurse

## 2024-06-16 VITALS — BP 120/82 | HR 77 | Temp 98.5°F | Resp 18 | Ht 72.0 in | Wt 173.0 lb

## 2024-06-16 DIAGNOSIS — Z0000 Encounter for general adult medical examination without abnormal findings: Secondary | ICD-10-CM

## 2024-06-16 DIAGNOSIS — Z87891 Personal history of nicotine dependence: Secondary | ICD-10-CM

## 2024-06-16 NOTE — Progress Notes (Unsigned)
 57y/o caucasian established male here for lab draw for 2026 insurance discount qualification.  Male executive panel and Hgba1c sent to [facility] today via courier.  [place] applied to left ac after venipuncture x 1 clean dry and intact on discharge ambulatory from clinic.  Patient signed alternative paperwork. Tobacco attestation and ROI Be Well 2026 paperwork today.  Tobacco cessation quiz given to patient along with video link and discussed must be returned prior to 09 Jul 2024 to meet requirements for insurance discount in addition to BP and lab requirements.  Discussed communicable disease employer policy with patient also.  Patient agreed with plan of care and had no further questions at this time.

## 2024-06-16 NOTE — Patient Instructions (Signed)
 How to Cope With Quitting Smoking Learn tips to help you cope with the challenges that come with quitting smoking. To view the content, go to this web address: https://pe.elsevier.[email or web address]  This video will expire on: 11/20/2025. If you need access to this video following this date, please reach out to the healthcare provider who assigned it to you. This information is not intended to replace advice given to you by your health care provider. Make sure you discuss any questions you have with your health care provider. Elsevier Patient Education  2024 [employer].

## 2024-06-17 LAB — CMP12+LP+TP+TSH+6AC+PSA+CBC…: Immature Grans (Abs): 0 x10E3/uL (ref 0.0–0.1)

## 2024-06-17 NOTE — Telephone Encounter (Signed)
 Patient had labs drawn 06/16/24  see results note 06/17/24  Hgba1c 5.2 BP 120-/72 met requirements for Be Well 2026 insurance discount but patient with positive tobacco attestation in previous 12 months needs to complete tobacco cessation quiz and video prior to 07/09/24 patient signed alternative paperwork 06/16/24.  Lipid panel results still pending.

## 2024-06-19 LAB — CMP12+LP+TP+TSH+6AC+PSA+CBC…
ALT: 18 IU/L (ref 0–44)
AST: 19 IU/L (ref 0–40)
Albumin: 4.5 g/dL (ref 3.8–4.9)
Alkaline Phosphatase: 73 IU/L (ref 44–121)
BUN/Creatinine Ratio: 15 (ref 9–20)
BUN: 16 mg/dL (ref 6–24)
Basophils Absolute: 0.1 x10E3/uL (ref 0.0–0.2)
Basos: 1 %
Bilirubin Total: 0.3 mg/dL (ref 0.0–1.2)
Calcium: 9.4 mg/dL (ref 8.7–10.2)
Chloride: 101 mmol/L (ref 96–106)
Chol/HDL Ratio: 2.6 ratio (ref 0.0–5.0)
Cholesterol, Total: 192 mg/dL (ref 100–199)
Creatinine, Ser: 1.06 mg/dL (ref 0.76–1.27)
EOS (ABSOLUTE): 0.1 x10E3/uL (ref 0.0–0.4)
Eos: 1 %
Estimated CHD Risk: <0.5 times avg. (ref 0.0–1.0)
Free Thyroxine Index: 1.7 (ref 1.2–4.9)
GGT: 26 IU/L (ref 0–65)
Globulin, Total: 2.5 g/dL (ref 1.5–4.5)
Glucose: 95 mg/dL (ref 70–99)
HDL: 73 mg/dL (ref >39)
Hematocrit: 45.6 % (ref 37.5–51.0)
Hemoglobin: 14.7 g/dL (ref 13.0–17.7)
Immature Grans (Abs): 0 x10E3/uL (ref 0.0–0.1)
Immature Granulocytes: 0 %
Iron: 62 ug/dL (ref 38–169)
LDH: 168 IU/L (ref 121–224)
LDL Chol Calc (NIH): 106 mg/dL — ABNORMAL HIGH (ref 0–99)
Lymphocytes Absolute: 2.1 x10E3/uL (ref 0.7–3.1)
Lymphs: 34 %
MCH: 32.5 pg (ref 26.6–33.0)
MCHC: 32.2 g/dL (ref 31.5–35.7)
MCV: 101 fL — ABNORMAL HIGH (ref 79–97)
Monocytes Absolute: 0.5 x10E3/uL (ref 0.1–0.9)
Monocytes: 8 %
Neutrophils Absolute: 3.4 x10E3/uL (ref 1.4–7.0)
Neutrophils: 55 %
Phosphorus: 3 mg/dL (ref 2.8–4.1)
Platelets: 250 x10E3/uL (ref 150–450)
Potassium: 4.8 mmol/L (ref 3.5–5.2)
Prostate Specific Ag, Serum: 0.5 ng/mL (ref 0.0–4.0)
RBC: 4.52 x10E6/uL (ref 4.14–5.80)
RDW: 12.5 % (ref 11.6–15.4)
Sodium: 138 mmol/L (ref 134–144)
T3 Uptake Ratio: 27 % (ref 24–39)
T4, Total: 6.3 ug/dL (ref 4.5–12.0)
TSH: 1.13 u[IU]/mL (ref 0.450–4.500)
Total Protein: 7 g/dL (ref 6.0–8.5)
Triglycerides: 68 mg/dL (ref 0–149)
Uric Acid: 5.2 mg/dL (ref 3.8–8.4)
VLDL Cholesterol Cal: 13 mg/dL (ref 5–40)
WBC: 6.2 x10E3/uL (ref 3.4–10.8)
eGFR: 83 mL/min/1.73 (ref >59)

## 2024-06-19 LAB — HGB A1C W/O EAG: Hgb A1c MFr Bld: 5.2 % (ref 4.8–5.6)

## 2024-06-24 NOTE — Telephone Encounter (Signed)
 Patient reminded tobacco cessation quiz and video completion due 09 Jul 2024 for insurance discount starting 09 Sep 2024.  Patient stated he plans to complete on Friday.  Patient verbalized understanding information and had no further questions at that time

## 2024-07-03 ENCOUNTER — Ambulatory Visit: Payer: Self-pay | Admitting: Registered Nurse

## 2024-07-03 DIAGNOSIS — Z720 Tobacco use: Secondary | ICD-10-CM

## 2024-07-07 NOTE — Telephone Encounter (Signed)
 Patient completed tobacco cessation video and quiz score 10/10  [facility] and alternative paperwork NP portion completed today and HR Jen given [facility] form met requirements for insurance discount starting 09 Sep 2024 smoker completed alternative.

## 2024-07-21 ENCOUNTER — Other Ambulatory Visit: Payer: Self-pay | Admitting: Family Medicine

## 2024-07-21 DIAGNOSIS — Z209 Contact with and (suspected) exposure to unspecified communicable disease: Secondary | ICD-10-CM

## 2024-07-23 ENCOUNTER — Encounter: Payer: Self-pay | Admitting: Family Medicine

## 2024-07-23 ENCOUNTER — Telehealth: Payer: Self-pay | Admitting: Internal Medicine

## 2024-07-23 ENCOUNTER — Ambulatory Visit: Payer: No Typology Code available for payment source | Admitting: Family Medicine

## 2024-07-23 ENCOUNTER — Other Ambulatory Visit (HOSPITAL_COMMUNITY)
Admission: RE | Admit: 2024-07-23 | Discharge: 2024-07-23 | Disposition: A | Discharge status: Home / Self Care | Source: Ambulatory Visit | Attending: Family Medicine | Admitting: Family Medicine

## 2024-07-23 VITALS — BP 128/78 | HR 107 | Ht 72.0 in | Wt 175.0 lb

## 2024-07-23 DIAGNOSIS — Z1212 Encounter for screening for malignant neoplasm of rectum: Secondary | ICD-10-CM

## 2024-07-23 DIAGNOSIS — Z0000 Encounter for general adult medical examination without abnormal findings: Secondary | ICD-10-CM | POA: Diagnosis not present

## 2024-07-23 DIAGNOSIS — B20 Human immunodeficiency virus [HIV] disease: Secondary | ICD-10-CM

## 2024-07-23 DIAGNOSIS — Z23 Encounter for immunization: Secondary | ICD-10-CM | POA: Diagnosis not present

## 2024-07-23 DIAGNOSIS — E559 Vitamin D deficiency, unspecified: Secondary | ICD-10-CM

## 2024-07-23 DIAGNOSIS — N529 Male erectile dysfunction, unspecified: Secondary | ICD-10-CM | POA: Diagnosis not present

## 2024-07-23 DIAGNOSIS — Z1211 Encounter for screening for malignant neoplasm of colon: Secondary | ICD-10-CM

## 2024-07-23 MED ORDER — GENVOYA 150-150-200-10 MG PO TABS
1.0000 | ORAL_TABLET | Freq: Every day | ORAL | 3 refills | Status: AC
Start: 2024-07-23 — End: ?

## 2024-07-23 MED ORDER — TADALAFIL 20 MG PO TABS: 10.0000 mg | ORAL_TABLET | ORAL | 11 refills | Status: DC | PRN

## 2024-07-23 MED ORDER — TADALAFIL 20 MG PO TABS: 10.0000 mg | ORAL_TABLET | ORAL | 11 refills | Status: AC | PRN

## 2024-07-23 NOTE — Progress Notes (Signed)
   Subjective:    Patient ID: Gregory Hamilton, male    DOB: 06/29/1966, 58 y.o.   MRN: [id number]  Discussed the use of AI scribe software for clinical note transcription with the patient, who gave verbal consent to proceed.  History of Present Illness   Gregory Hamilton is a 58 year old male with diabetes and HIV who presents for a health and wellness exam.  . He is currently taking Genvoya  for HIV management and has a prescription for doxycycline  for PrEP. He previously used Viagra  but experienced significant heartburn and is considering switching to Cialis . He has multiple sexual partners but does not express concern about sexually transmitted infections at this time.  During a recent health and wellness exam at work, his cholesterol was found to be slightly elevated. He recalls a previous prescription for vitamin D  supplementation, which he did not complete. His vitamin D  level was noted to be 22, which was considered insufficient, but he drinks a lot of milk, approximately a gallon a week.  He recalls receiving a pneumonia vaccine in 2014 and is due for an update. His shingles and tetanus vaccinations are up to date. He had a recent cut with scissors but did not require a tetanus booster as it was not a deep wound.  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 He works at a place that offers health and wellness exams tied to healthcare discounts.           Review of Systems  All other systems reviewed and are negative.      Objective:    Physical Exam Alert and in no distress. Tympanic membranes and canals are normal. Pharyngeal area is normal. Neck is supple without adenopathy or thyromegaly. Cardiac exam shows a regular sinus rhythm without murmurs or gallops. Lungs are clear to auscultation.  Abdominal exam negative.  No inguinal or axillary adenopathy.  Anal Pap was obtained.             Assessment & Plan:  Assessment and  Plan    HIV infection Managed with Genvoya . Future options include twice-yearly injections pending availability and cost. - Continue Genvoya . - Order CD4 count and viral load.  Erectile dysfunction Viagra  caused heartburn. Discussed switching to Cialis  for fewer side effects. - Prescribe Cialis  as needed.  Hypercholesterolemia Slightly elevated cholesterol. No immediate medication needed.  Vitamin D  insufficiency Level 22, insufficient but not concerning. No treatment needed at this level. - Recommend multivitamin with extra vitamin D .     Also continue with doxycycline  for Doxy prep. Use Cialis  on an as-needed basis for ED

## 2024-07-23 NOTE — Addendum Note (Signed)
 Addended by: VICCI HUSBAND A on: 07/23/2024 04:24 PM   Modules accepted: Orders

## 2024-07-23 NOTE — Addendum Note (Signed)
 Addended by: JOYCE NORLEEN BROCKS on: 07/23/2024 04:10 PM   Modules accepted: Orders

## 2024-07-23 NOTE — Telephone Encounter (Signed)
 Ordered the correct one  Copied from CRM #[id number]. Topic: Clinical - Request for Lab/Test Order >> Jul 23, 2024  1:46 PM Tinnie BROCKS wrote: Reason for CRM: Sueanne from [facility] is calling to let us  know that there was an incorrect order for this patient. She says there should be a cytology pap instead of a non-gyn pap. She is requesting a call back once the order is corrected at [phone].

## 2024-07-24 LAB — T-HELPER CELLS (CD4) COUNT (NOT AT ARMC)
% CD 4 Pos. Lymph.: 27.7 % — ABNORMAL LOW (ref 30.8–58.5)
Absolute CD 4 Helper: 748 /uL (ref 359–1519)
Basophils Absolute: 0.1 x10E3/uL (ref 0.0–0.2)
Basos: 1 %
EOS (ABSOLUTE): 0.1 x10E3/uL (ref 0.0–0.4)
Eos: 2 %
Hematocrit: 46.4 % (ref 37.5–51.0)
Hemoglobin: 15.1 g/dL (ref 13.0–17.7)
Immature Grans (Abs): 0 x10E3/uL (ref 0.0–0.1)
Immature Granulocytes: 0 %
Lymphocytes Absolute: 2.7 x10E3/uL (ref 0.7–3.1)
Lymphs: 39 %
MCH: 33 pg (ref 26.6–33.0)
MCHC: 32.5 g/dL (ref 31.5–35.7)
MCV: 101 fL — ABNORMAL HIGH (ref 79–97)
Monocytes Absolute: 0.5 x10E3/uL (ref 0.1–0.9)
Monocytes: 8 %
Neutrophils Absolute: 3.4 x10E3/uL (ref 1.4–7.0)
Neutrophils: 50 %
Platelets: 245 x10E3/uL (ref 150–450)
RBC: 4.58 x10E6/uL (ref 4.14–5.80)
RDW: 12.2 % (ref 11.6–15.4)
WBC: 6.8 x10E3/uL (ref 3.4–10.8)

## 2024-07-24 LAB — HIV-1 RNA QUANT-NO REFLEX-BLD: HIV-1 RNA Viral Load: <20 {copies}/mL

## 2024-07-25 ENCOUNTER — Ambulatory Visit: Payer: Self-pay | Admitting: Family Medicine

## 2024-07-28 LAB — CYTOLOGY - PAP
Comment: NEGATIVE
Diagnosis: UNDETERMINED — AB
High risk HPV: NEGATIVE

## 2024-09-21 ENCOUNTER — Ambulatory Visit: Admitting: General Practice

## 2024-09-21 DIAGNOSIS — Z23 Encounter for immunization: Secondary | ICD-10-CM

## 2024-09-22 DIAGNOSIS — Z23 Encounter for immunization: Secondary | ICD-10-CM | POA: Insufficient documentation

## 2024-09-22 MED ORDER — COVID-19 MRNA VAC-TRIS(PFIZER) 30 MCG/0.3ML IM SUSY
0.3000 mL | PREFILLED_SYRINGE | Freq: Once | INTRAMUSCULAR | 0 refills | Status: AC
Start: 2024-09-22 — End: 2024-09-22

## 2024-09-22 NOTE — Progress Notes (Signed)
 Late entry, Fluzone and Comirnaty vaccines administered on 09/21/2024.  Comirnaty mRNA 0.3 mL vaccine given in Lt deltoid, IM. Lot # [id number], vaccine exp. 05/28/2025, NDC # 9930-7471-89. VIS dated 01/10/2024 provided to the employee. Provided instructions if any complications occur and requested a callback or revisit to the clinic.

## 2024-10-26 ENCOUNTER — Other Ambulatory Visit: Payer: Self-pay | Admitting: General Practice

## 2024-11-27 ENCOUNTER — Other Ambulatory Visit: Payer: Self-pay | Admitting: Family Medicine

## 2024-11-27 DIAGNOSIS — Z209 Contact with and (suspected) exposure to unspecified communicable disease: Secondary | ICD-10-CM

## 2025-01-12 ENCOUNTER — Encounter: Payer: Self-pay | Admitting: Registered Nurse

## 2025-01-12 ENCOUNTER — Telehealth: Payer: Self-pay | Admitting: Registered Nurse

## 2025-01-12 DIAGNOSIS — S90511A Abrasion, right ankle, initial encounter: Secondary | ICD-10-CM

## 2025-01-12 MED ORDER — TRIPLE ANTIBIOTIC 3.5-400-5000 EX OINT: 1.0000 | TOPICAL_OINTMENT | Freq: Every day | CUTANEOUS | Status: AC

## 2025-01-12 MED ORDER — AQUAPHOR EX OINT: TOPICAL_OINTMENT | CUTANEOUS | Status: AC | PRN

## 2025-01-12 NOTE — Telephone Encounter (Signed)
 Healing abrasion lateral right ankle after fall in parking lot 01/08/25 at work.  Was notified of injury by safety officer and contacted patient for evaluation/follow up.  Clinic was closed 1/30-01/11/25 due to weather.  Skin with erythema dry and central scabbing without edema.  Patient reported had initially applied neosporin and covered with bandaid but left off today.  Discussed sock is rubbing skin apply aquaphor or triple antibiotic and bandage supplied while at work to decrease clothing friction to healing area.  No sign of infection at this time and continue to monitor and cleanse daily with soap and water gently.  Patient A&Ox3 gait sure and steady skin warm dry pink  Patient agreed with plan of care and had no further questions at this time.  Exitcare handout on abrasion to my chart.

## 2025-01-15 ENCOUNTER — Other Ambulatory Visit: Payer: Self-pay | Admitting: Family Medicine

## 2025-01-15 DIAGNOSIS — N529 Male erectile dysfunction, unspecified: Secondary | ICD-10-CM

## 2025-01-15 NOTE — Telephone Encounter (Signed)
 Pt was sent in cialis  instead back in August 2025

## 2025-07-26 ENCOUNTER — Encounter: Payer: Self-pay | Admitting: Family Medicine
